# Patient Record
Sex: Male | Born: 1970
Health system: Southern US, Community
[De-identification: ages and names within clinical notes are randomized; demographics above are authoritative.]

## PROBLEM LIST (undated history)

## (undated) DIAGNOSIS — G4730 Sleep apnea, unspecified: Secondary | ICD-10-CM

## (undated) DIAGNOSIS — E785 Hyperlipidemia, unspecified: Secondary | ICD-10-CM

## (undated) DIAGNOSIS — F419 Anxiety disorder, unspecified: Secondary | ICD-10-CM

## (undated) DIAGNOSIS — E119 Type 2 diabetes mellitus without complications: Secondary | ICD-10-CM

## (undated) HISTORY — DX: Sleep apnea, unspecified: G47.30

## (undated) HISTORY — DX: Type 2 diabetes mellitus without complications: E11.9

## (undated) HISTORY — DX: Anxiety disorder, unspecified: F41.9

## (undated) HISTORY — DX: Hyperlipidemia, unspecified: E78.5

---

## 2008-04-09 HISTORY — PX: ANKLE ARTHROSCOPY: SUR85

## 2010-07-10 ENCOUNTER — Other Ambulatory Visit (HOSPITAL_COMMUNITY): Payer: Self-pay | Admitting: Sports Medicine

## 2010-07-10 ENCOUNTER — Ambulatory Visit (HOSPITAL_COMMUNITY)
Admission: RE | Admit: 2010-07-10 | Discharge: 2010-07-10 | Disposition: A | Discharge status: Home / Self Care | Payer: PRIVATE HEALTH INSURANCE | Source: Ambulatory Visit | Attending: Sports Medicine | Admitting: Sports Medicine

## 2010-07-10 DIAGNOSIS — M25572 Pain in left ankle and joints of left foot: Secondary | ICD-10-CM

## 2010-07-10 DIAGNOSIS — J3489 Other specified disorders of nose and nasal sinuses: Secondary | ICD-10-CM | POA: Insufficient documentation

## 2010-07-10 DIAGNOSIS — Z01818 Encounter for other preprocedural examination: Secondary | ICD-10-CM | POA: Insufficient documentation

## 2016-08-31 DIAGNOSIS — R739 Hyperglycemia, unspecified: Secondary | ICD-10-CM | POA: Diagnosis not present

## 2016-08-31 DIAGNOSIS — Z1389 Encounter for screening for other disorder: Secondary | ICD-10-CM | POA: Diagnosis not present

## 2016-08-31 DIAGNOSIS — E785 Hyperlipidemia, unspecified: Secondary | ICD-10-CM | POA: Diagnosis not present

## 2016-08-31 DIAGNOSIS — R030 Elevated blood-pressure reading, without diagnosis of hypertension: Secondary | ICD-10-CM | POA: Diagnosis not present

## 2016-11-05 DIAGNOSIS — Z800 Family history of malignant neoplasm of digestive organs: Secondary | ICD-10-CM | POA: Diagnosis not present

## 2016-11-05 DIAGNOSIS — Z86010 Personal history of colonic polyps: Secondary | ICD-10-CM | POA: Diagnosis not present

## 2016-11-05 DIAGNOSIS — Z1211 Encounter for screening for malignant neoplasm of colon: Secondary | ICD-10-CM | POA: Diagnosis not present

## 2016-11-05 DIAGNOSIS — D124 Benign neoplasm of descending colon: Secondary | ICD-10-CM | POA: Diagnosis not present

## 2016-11-05 DIAGNOSIS — K635 Polyp of colon: Secondary | ICD-10-CM | POA: Diagnosis not present

## 2017-04-25 DIAGNOSIS — J0100 Acute maxillary sinusitis, unspecified: Secondary | ICD-10-CM | POA: Diagnosis not present

## 2017-06-18 DIAGNOSIS — Z0000 Encounter for general adult medical examination without abnormal findings: Secondary | ICD-10-CM | POA: Diagnosis not present

## 2017-06-18 DIAGNOSIS — Z23 Encounter for immunization: Secondary | ICD-10-CM | POA: Diagnosis not present

## 2017-06-18 DIAGNOSIS — E785 Hyperlipidemia, unspecified: Secondary | ICD-10-CM | POA: Diagnosis not present

## 2017-08-30 DIAGNOSIS — E785 Hyperlipidemia, unspecified: Secondary | ICD-10-CM | POA: Diagnosis not present

## 2017-12-10 DIAGNOSIS — E785 Hyperlipidemia, unspecified: Secondary | ICD-10-CM | POA: Diagnosis not present

## 2017-12-10 DIAGNOSIS — R7303 Prediabetes: Secondary | ICD-10-CM | POA: Diagnosis not present

## 2018-01-24 DIAGNOSIS — Z23 Encounter for immunization: Secondary | ICD-10-CM | POA: Diagnosis not present

## 2018-03-20 DIAGNOSIS — Z6837 Body mass index (BMI) 37.0-37.9, adult: Secondary | ICD-10-CM | POA: Diagnosis not present

## 2018-03-20 DIAGNOSIS — L03811 Cellulitis of head [any part, except face]: Secondary | ICD-10-CM | POA: Diagnosis not present

## 2018-04-03 DIAGNOSIS — L03221 Cellulitis of neck: Secondary | ICD-10-CM | POA: Diagnosis not present

## 2018-04-03 DIAGNOSIS — L728 Other follicular cysts of the skin and subcutaneous tissue: Secondary | ICD-10-CM | POA: Diagnosis not present

## 2018-04-04 DIAGNOSIS — R7303 Prediabetes: Secondary | ICD-10-CM | POA: Diagnosis not present

## 2018-04-04 DIAGNOSIS — E785 Hyperlipidemia, unspecified: Secondary | ICD-10-CM | POA: Diagnosis not present

## 2018-04-04 DIAGNOSIS — L03811 Cellulitis of head [any part, except face]: Secondary | ICD-10-CM | POA: Diagnosis not present

## 2018-06-27 DIAGNOSIS — J069 Acute upper respiratory infection, unspecified: Secondary | ICD-10-CM | POA: Diagnosis not present

## 2018-07-03 DIAGNOSIS — Z0000 Encounter for general adult medical examination without abnormal findings: Secondary | ICD-10-CM | POA: Diagnosis not present

## 2018-07-03 DIAGNOSIS — R739 Hyperglycemia, unspecified: Secondary | ICD-10-CM | POA: Diagnosis not present

## 2018-07-03 DIAGNOSIS — E669 Obesity, unspecified: Secondary | ICD-10-CM | POA: Diagnosis not present

## 2018-07-03 DIAGNOSIS — E785 Hyperlipidemia, unspecified: Secondary | ICD-10-CM | POA: Diagnosis not present

## 2018-07-22 DIAGNOSIS — G4730 Sleep apnea, unspecified: Secondary | ICD-10-CM | POA: Diagnosis not present

## 2018-08-08 DIAGNOSIS — G4733 Obstructive sleep apnea (adult) (pediatric): Secondary | ICD-10-CM | POA: Diagnosis not present

## 2020-04-10 ENCOUNTER — Encounter (HOSPITAL_COMMUNITY): Payer: Self-pay | Admitting: Internal Medicine

## 2020-04-10 ENCOUNTER — Inpatient Hospital Stay (HOSPITAL_COMMUNITY)
Admission: AD | Admit: 2020-04-10 | Discharge: 2020-04-12 | DRG: 281 | Disposition: A | Discharge status: Home / Self Care | Payer: Commercial Managed Care - PPO | Source: Other Acute Inpatient Hospital | Attending: Cardiovascular Disease | Admitting: Cardiovascular Disease

## 2020-04-10 DIAGNOSIS — I5181 Takotsubo syndrome: Secondary | ICD-10-CM | POA: Diagnosis present

## 2020-04-10 DIAGNOSIS — I214 Non-ST elevation (NSTEMI) myocardial infarction: Principal | ICD-10-CM | POA: Diagnosis present

## 2020-04-10 DIAGNOSIS — R079 Chest pain, unspecified: Secondary | ICD-10-CM | POA: Diagnosis present

## 2020-04-10 DIAGNOSIS — I200 Unstable angina: Secondary | ICD-10-CM | POA: Diagnosis not present

## 2020-04-10 DIAGNOSIS — E119 Type 2 diabetes mellitus without complications: Secondary | ICD-10-CM | POA: Diagnosis present

## 2020-04-10 DIAGNOSIS — E785 Hyperlipidemia, unspecified: Secondary | ICD-10-CM | POA: Diagnosis present

## 2020-04-10 DIAGNOSIS — I428 Other cardiomyopathies: Secondary | ICD-10-CM

## 2020-04-10 DIAGNOSIS — E782 Mixed hyperlipidemia: Secondary | ICD-10-CM | POA: Diagnosis not present

## 2020-04-10 DIAGNOSIS — I249 Acute ischemic heart disease, unspecified: Secondary | ICD-10-CM

## 2020-04-10 LAB — COMPREHENSIVE METABOLIC PANEL
ALT: 34 U/L (ref 0–44)
AST: 26 U/L (ref 15–41)
Albumin: 3.6 g/dL (ref 3.5–5.0)
Alkaline Phosphatase: 57 U/L (ref 38–126)
Anion gap: 12 (ref 5–15)
BUN: 8 mg/dL (ref 6–20)
CO2: 25 mmol/L (ref 22–32)
Calcium: 9 mg/dL (ref 8.9–10.3)
Chloride: 100 mmol/L (ref 98–111)
Creatinine, Ser: 0.95 mg/dL (ref 0.61–1.24)
GFR, Estimated: >60 mL/min (ref >60)
Glucose, Bld: 103 mg/dL — ABNORMAL HIGH (ref 70–99)
Potassium: 3.6 mmol/L (ref 3.5–5.1)
Sodium: 137 mmol/L (ref 135–145)
Total Bilirubin: 0.8 mg/dL (ref 0.3–1.2)
Total Protein: 7 g/dL (ref 6.5–8.1)

## 2020-04-10 LAB — PROTIME-INR
INR: 1.1 (ref 0.8–1.2)
Prothrombin Time: 13.7 seconds (ref 11.4–15.2)

## 2020-04-10 LAB — HIV ANTIBODY (ROUTINE TESTING W REFLEX): HIV Screen 4th Generation wRfx: NONREACTIVE

## 2020-04-10 LAB — TROPONIN I (HIGH SENSITIVITY)
Troponin I (High Sensitivity): 524 ng/L (ref <18)
Troponin I (High Sensitivity): 971 ng/L (ref <18)

## 2020-04-10 LAB — APTT: aPTT: 41 seconds — ABNORMAL HIGH (ref 24–36)

## 2020-04-10 LAB — MRSA PCR SCREENING: MRSA by PCR: NEGATIVE

## 2020-04-10 LAB — MAGNESIUM: Magnesium: 2.4 mg/dL (ref 1.7–2.4)

## 2020-04-10 LAB — GLUCOSE, CAPILLARY: Glucose-Capillary: 102 mg/dL — ABNORMAL HIGH (ref 70–99)

## 2020-04-10 LAB — HEPARIN LEVEL (UNFRACTIONATED): Heparin Unfractionated: <0.1 IU/mL — ABNORMAL LOW (ref 0.30–0.70)

## 2020-04-10 MED ORDER — ASPIRIN EC 81 MG PO TBEC
81.0000 mg | DELAYED_RELEASE_TABLET | Freq: Every day | ORAL | Status: DC
  Administered 2020-04-11 – 2020-04-12 (×2): 81 mg via ORAL
  Filled 2020-04-10 (×2): qty 1

## 2020-04-10 MED ORDER — HEPARIN BOLUS VIA INFUSION
2000.0000 [IU] | Freq: Once | INTRAVENOUS | Status: AC
  Administered 2020-04-10: 2000 [IU] via INTRAVENOUS
  Filled 2020-04-10: qty 2000

## 2020-04-10 MED ORDER — ONDANSETRON HCL 4 MG/2ML IJ SOLN: 4.0000 mg | Freq: Four times a day (QID) | INTRAMUSCULAR | Status: DC | PRN

## 2020-04-10 MED ORDER — NITROGLYCERIN 0.4 MG SL SUBL: 0.4000 mg | SUBLINGUAL_TABLET | SUBLINGUAL | Status: DC | PRN

## 2020-04-10 MED ORDER — ACETAMINOPHEN 325 MG PO TABS: 650.0000 mg | ORAL_TABLET | ORAL | Status: DC | PRN

## 2020-04-10 MED ORDER — HEPARIN (PORCINE) 25000 UT/250ML-% IV SOLN
2000.0000 [IU]/h | INTRAVENOUS | Status: DC
  Administered 2020-04-10: 1400 [IU]/h via INTRAVENOUS
  Administered 2020-04-11: 2000 [IU]/h via INTRAVENOUS
  Filled 2020-04-10 (×3): qty 250

## 2020-04-10 MED ORDER — ROSUVASTATIN CALCIUM 20 MG PO TABS: 20.0000 mg | ORAL_TABLET | Freq: Every day | ORAL | Status: DC

## 2020-04-10 NOTE — H&P (Addendum)
CARDIOLOGY ADMISSION NOTE  Patient ID: Patrick Strickland MRN: [id number] DOB/AGE: 49/49/1972 49 y.o.  Admit date: 04/10/2020 Primary Physician   None Primary Cardiologist   None Chief Complaint    Chest Pain  HPI:   Mr. Patrick Strickland is a 50 year old man with HLD and DMII who presented to OSH with acute onset chest pain and was found to have an NSTEMI.   Patient developed acute onset substernal chest pain around 1400 on 04/09/20. He describes it as a persistent "gas bubble sensation" with associated chest tightness, dizziness, nausea, and diaphoresis. There were no clear exacerbating or alleviating factors. Given his symptoms, he presented to an OSH ED for evaluation. EKG was reportedly without any dynamic ST changes. Troponin I was found to be elevated 0.10 > 0.10 > 0.24. He was started on a heparin gtt and plans were made for transfer.  At present, patient states that he is comfortable and denies any active chest pain, exertional chest pressure/discomfort, dyspnea/tachypnea, paroxysmal nocturnal dyspnea/orthopnea, irregular heart beat/palpitations, presyncope/syncope, lower extremity edema, claudication, or abdominal distention.    History reviewed. No pertinent past medical history.  History reviewed. No pertinent surgical history.  Not on File No current facility-administered medications on file prior to encounter.   No current outpatient medications on file prior to encounter.   Social History   Socioeconomic History  . Marital status: Married    Spouse name: Not on file  . Number of children: Not on file  . Years of education: Not on file  . Highest education level: Not on file  Occupational History  . Not on file  Tobacco Use  . Smoking status: Never Smoker  . Smokeless tobacco: Current User    Types: Chew  Vaping Use  . Vaping Use: Never used  Substance and Sexual Activity  . Alcohol use: Yes    Alcohol/week: 1.0 standard drink    Types: 1 Cans of beer per week  . Drug use: Not  on file  . Sexual activity: Yes  Other Topics Concern  . Not on file  Social History Narrative  . Not on file   Social Determinants of Health   Financial Resource Strain: Not on file  Food Insecurity: Not on file  Transportation Needs: Not on file  Physical Activity: Not on file  Stress: Not on file  Social Connections: Not on file  Intimate Partner Violence: Not on file    History reviewed. No pertinent family history.   Review of Systems: [y] = yes, [ ]  = no       General: Weight gain [] ; Weight loss [ ] ; Anorexia [ ] ; Fatigue [ ] ; Fever [ ] ; Chills [ ] ; Weakness [ ]     Cardiac: as reported in HPI  Pulmonary: Cough [ ] ; Wheezing[ ] ; Hemoptysis[ ] ; Sputum [ ] ; Snoring [ ]     GI: Vomiting[ ] ; Dysphagia[ ] ; Melena[ ] ; Hematochezia [ ] ; Heartburn[ ] ; Abdominal pain [ ] ; Constipation [ ] ; Diarrhea [ ] ; BRBPR [ ]     GU: Hematuria[ ] ; Dysuria [ ] ; Nocturia[ ]   Vascular: Pain in legs with walking [ ] ; Pain in feet with lying flat [ ] ; Non-healing sores [ ] ; Stroke [ ] ; TIA [ ] ; Slurred speech [ ] ;    Neuro: Headaches[ ] ; Vertigo[ ] ; Seizures[ ] ; Paresthesias[ ] ;Blurred vision [ ] ; Diplopia [ ] ; Vision changes [ ]     Ortho/Skin: Arthritis [ ] ; Joint pain [ ] ; Muscle pain [ ] ; Joint swelling [ ] ; Back  Pain [ ] ; Rash [ ]     Psych: Depression[ ] ; Anxiety[ ]     Heme: Bleeding problems [ ] ; Clotting disorders [ ] ; Anemia [ ]     Endocrine: Diabetes [ ] ; Thyroid dysfunction[ ]   Physical Exam: Blood pressure 128/85, pulse 81, temperature 98 F (36.7 C), temperature source Oral, resp. rate 14, height 5\' 10"  (1.778 m), weight 114.6 kg, SpO2 94 %.   GENERAL: Patient is afebrile, Vital signs reviewed, Well appearing, Patient appears comfortable, Alert and lucid. EYES: Normal inspection. HEENT:  normocephalic, atraumatic , normal ENT inspection. ORAL:  Moist NECK:  supple , normal inspection. CARD:  regular rate and rhythm, heart sounds normal. RESP:  no respiratory distress, breath  sounds normal. ABD: soft, nondistended, nontender to palpation  MUSC:  normal ROM, non-tender , no pedal edema . SKIN: color normal, no rash, warm, dry . NEURO: awake & alert, lucid, no motor/sensory deficit. Gait stable. PSYCH: mood/affect normal.   Labs: Lab Results  Component Value Date   BUN 8 04/10/2020   Lab Results  Component Value Date   CREATININE 0.95 04/10/2020   Lab Results  Component Value Date   NA 137 04/10/2020   K 3.6 04/10/2020   CL 100 04/10/2020   CO2 25 04/10/2020   No results found for: TROPONINI No results found for: WBC, HGB, HCT, MCV, PLT No results found for: CHOL, HDL, LDLCALC, LDLDIRECT, TRIG, CHOLHDL Lab Results  Component Value Date   ALT 34 04/10/2020   AST 26 04/10/2020   ALKPHOS 57 04/10/2020   BILITOT 0.8 04/10/2020      Radiology:  CXR:  No acute process CTA Chest: No evidence of PE  EKG:  Pending  ASSESSMENT AND PLAN:   Mr. Patrick Strickland is a 50 year old man with HLD and DMII who presented to OSH with acute onset chest pain and was found to have an NSTEMI.   # NSTEMI # HLD # DMII - Tentative plan for diagnostic LHC in AM  - Patient to be NPO except for meds - Cont heparin IV infusion protocol - Symptom management with SLNTG prn - Trend trop and serial ECGs; monitor on tele - TTE in AM - Aggressive risk factor modification with glycemic control, smoking cessation, and GDMT              - Lipid panel and A1c pending  - Cont ASA 81 mg and rosuvastatin 20mg  daily    Signed: 06/08/2020 04/10/2020, 10:21 PM

## 2020-04-10 NOTE — Progress Notes (Addendum)
ANTICOAGULATION CONSULT NOTE - Initial Consult  Pharmacy Consult for heparin  Indication: chest pain/ACS  Not on File  Patient Measurements: Height: 5\' 10"  (177.8 cm) Weight: 114.6 kg (252 lb 9.6 oz) (scale A) IBW/kg (Calculated) : 73 HEPARIN DW (KG): 98.2   Vital Signs: Temp: 98 F (36.7 C) (01/02 1944) Temp Source: Oral (01/02 1944) BP: 128/85 (01/02 1944) Pulse Rate: 81 (01/02 1944)  Labs: No results for input(s): HGB, HCT, PLT, APTT, LABPROT, INR, HEPARINUNFRC, HEPRLOWMOCWT, CREATININE, CKTOTAL, CKMB, TROPONINIHS in the last 72 hours.  CrCl cannot be calculated (No successful lab value found.).   Medical History: No past medical history on file.    Assessment: 50 yo male with CP transferred from [place]. He is on heparin for r/o ACS and current infusion is running at 1400 units/hr (started early this morning) -hg= 15.9 and plt= 168 (at [place])   Goal of Therapy:  Heparin level 0.3-0.7 units/ml Monitor platelets by anticoagulation protocol: Yes   Plan:   -Continue heparin at 1400  units/hr -Heparin level later tonight -Daily heparin level and CBC  [place], PharmD Clinical Pharmacist **Pharmacist phone directory can now be found on [email or web address] ([email or web address]).  Listed under [facility].   Addendum -heparin level < 0.1  Plan -heparin bolus 2000 units x1 and increase infusion to 1700 units/hr -Heparin level in 6 hours and daily wth CBC daily  [facility], PharmD Clinical Pharmacist **Pharmacist phone directory can now be found on [email or web address] ([email or web address]).  Listed under [facility].

## 2020-04-10 NOTE — Plan of Care (Signed)
  Problem: Education: Goal: Understanding of cardiac disease, CV risk reduction, and recovery process will improve Outcome: Progressing Goal: Understanding of medication regimen will improve Outcome: Progressing   Problem: Activity: Goal: Ability to tolerate increased activity will improve Outcome: Progressing   Problem: Cardiac: Goal: Ability to achieve and maintain adequate cardiopulmonary perfusion will improve Outcome: Progressing

## 2020-04-11 ENCOUNTER — Encounter (HOSPITAL_COMMUNITY): Payer: Self-pay | Admitting: Internal Medicine

## 2020-04-11 ENCOUNTER — Other Ambulatory Visit: Payer: Self-pay

## 2020-04-11 ENCOUNTER — Inpatient Hospital Stay (HOSPITAL_COMMUNITY): Payer: Commercial Managed Care - PPO

## 2020-04-11 ENCOUNTER — Encounter (HOSPITAL_COMMUNITY)
Admission: AD | Disposition: A | Discharge status: Home / Self Care | Payer: Self-pay | Attending: Cardiovascular Disease

## 2020-04-11 DIAGNOSIS — I214 Non-ST elevation (NSTEMI) myocardial infarction: Secondary | ICD-10-CM

## 2020-04-11 DIAGNOSIS — I200 Unstable angina: Secondary | ICD-10-CM | POA: Diagnosis not present

## 2020-04-11 DIAGNOSIS — R079 Chest pain, unspecified: Secondary | ICD-10-CM | POA: Diagnosis not present

## 2020-04-11 DIAGNOSIS — E782 Mixed hyperlipidemia: Secondary | ICD-10-CM

## 2020-04-11 HISTORY — PX: LEFT HEART CATH AND CORONARY ANGIOGRAPHY: CATH118249

## 2020-04-11 LAB — CBC
HCT: 48.4 % (ref 39.0–52.0)
Hemoglobin: 16.7 g/dL (ref 13.0–17.0)
MCH: 29.1 pg (ref 26.0–34.0)
MCHC: 34.5 g/dL (ref 30.0–36.0)
MCV: 84.5 fL (ref 80.0–100.0)
Platelets: 179 10*3/uL (ref 150–400)
RBC: 5.73 MIL/uL (ref 4.22–5.81)
RDW: 13.6 % (ref 11.5–15.5)
WBC: 8.2 10*3/uL (ref 4.0–10.5)
nRBC: 0 % (ref 0.0–0.2)

## 2020-04-11 LAB — BASIC METABOLIC PANEL
Anion gap: 12 (ref 5–15)
BUN: 8 mg/dL (ref 6–20)
CO2: 21 mmol/L — ABNORMAL LOW (ref 22–32)
Calcium: 8.9 mg/dL (ref 8.9–10.3)
Chloride: 102 mmol/L (ref 98–111)
Creatinine, Ser: 0.79 mg/dL (ref 0.61–1.24)
GFR, Estimated: >60 mL/min (ref >60)
Glucose, Bld: 103 mg/dL — ABNORMAL HIGH (ref 70–99)
Potassium: 4 mmol/L (ref 3.5–5.1)
Sodium: 135 mmol/L (ref 135–145)

## 2020-04-11 LAB — LIPID PANEL
Cholesterol: 160 mg/dL (ref 0–200)
HDL: 41 mg/dL (ref >40)
LDL Cholesterol: 88 mg/dL (ref 0–99)
Total CHOL/HDL Ratio: 3.9 RATIO
Triglycerides: 153 mg/dL — ABNORMAL HIGH (ref <150)
VLDL: 31 mg/dL (ref 0–40)

## 2020-04-11 LAB — ECHOCARDIOGRAM COMPLETE
Area-P 1/2: 3.99 cm2
Height: 70 in
S' Lateral: 2.9 cm
Weight: 4041.6 oz

## 2020-04-11 LAB — HEPARIN LEVEL (UNFRACTIONATED): Heparin Unfractionated: 0.15 IU/mL — ABNORMAL LOW (ref 0.30–0.70)

## 2020-04-11 LAB — TROPONIN I (HIGH SENSITIVITY)
Troponin I (High Sensitivity): 2798 ng/L (ref <18)
Troponin I (High Sensitivity): 3104 ng/L (ref <18)

## 2020-04-11 LAB — GLUCOSE, CAPILLARY: Glucose-Capillary: 112 mg/dL — ABNORMAL HIGH (ref 70–99)

## 2020-04-11 SURGERY — LEFT HEART CATH AND CORONARY ANGIOGRAPHY
Anesthesia: LOCAL

## 2020-04-11 MED ORDER — HEPARIN (PORCINE) IN NACL 1000-0.9 UT/500ML-% IV SOLN
INTRAVENOUS | Status: AC
  Filled 2020-04-11: qty 1000

## 2020-04-11 MED ORDER — HEPARIN BOLUS VIA INFUSION
2000.0000 [IU] | Freq: Once | INTRAVENOUS | Status: AC
  Administered 2020-04-11: 2000 [IU] via INTRAVENOUS
  Filled 2020-04-11: qty 2000

## 2020-04-11 MED ORDER — GADOBUTROL 1 MMOL/ML IV SOLN
13.0000 mL | Freq: Once | INTRAVENOUS | Status: AC | PRN
  Administered 2020-04-11: 13 mL via INTRAVENOUS

## 2020-04-11 MED ORDER — MIDAZOLAM HCL 2 MG/2ML IJ SOLN
INTRAMUSCULAR | Status: DC | PRN
  Administered 2020-04-11 (×2): 1 mg via INTRAVENOUS

## 2020-04-11 MED ORDER — SODIUM CHLORIDE 0.9% FLUSH
3.0000 mL | Freq: Two times a day (BID) | INTRAVENOUS | Status: DC
  Administered 2020-04-12: 3 mL via INTRAVENOUS

## 2020-04-11 MED ORDER — PERFLUTREN LIPID MICROSPHERE
1.0000 mL | INTRAVENOUS | Status: AC | PRN
  Administered 2020-04-11: 2 mL via INTRAVENOUS
  Filled 2020-04-11: qty 10

## 2020-04-11 MED ORDER — HEPARIN (PORCINE) IN NACL 1000-0.9 UT/500ML-% IV SOLN
INTRAVENOUS | Status: DC | PRN
  Administered 2020-04-11: 500 mL

## 2020-04-11 MED ORDER — FENTANYL CITRATE (PF) 100 MCG/2ML IJ SOLN
INTRAMUSCULAR | Status: DC | PRN
  Administered 2020-04-11: 50 ug via INTRAVENOUS
  Administered 2020-04-11: 25 ug via INTRAVENOUS

## 2020-04-11 MED ORDER — SODIUM CHLORIDE 0.9 % IV SOLN: 250.0000 mL | INTRAVENOUS | Status: DC | PRN

## 2020-04-11 MED ORDER — SACUBITRIL-VALSARTAN 24-26 MG PO TABS
1.0000 | ORAL_TABLET | Freq: Two times a day (BID) | ORAL | Status: DC
  Administered 2020-04-11 – 2020-04-12 (×2): 1 via ORAL
  Filled 2020-04-11 (×2): qty 1

## 2020-04-11 MED ORDER — SODIUM CHLORIDE 0.9 % IV SOLN: INTRAVENOUS | Status: AC

## 2020-04-11 MED ORDER — MIDAZOLAM HCL 2 MG/2ML IJ SOLN
INTRAMUSCULAR | Status: AC
  Filled 2020-04-11: qty 2

## 2020-04-11 MED ORDER — FENTANYL CITRATE (PF) 100 MCG/2ML IJ SOLN
INTRAMUSCULAR | Status: AC
  Filled 2020-04-11: qty 2

## 2020-04-11 MED ORDER — ROSUVASTATIN CALCIUM 20 MG PO TABS
40.0000 mg | ORAL_TABLET | Freq: Every day | ORAL | Status: DC
  Administered 2020-04-11 – 2020-04-12 (×2): 40 mg via ORAL
  Filled 2020-04-11 (×2): qty 2

## 2020-04-11 MED ORDER — ENOXAPARIN SODIUM 40 MG/0.4ML ~~LOC~~ SOLN
40.0000 mg | SUBCUTANEOUS | Status: DC
  Administered 2020-04-12: 40 mg via SUBCUTANEOUS
  Filled 2020-04-11: qty 0.4

## 2020-04-11 MED ORDER — HEPARIN SODIUM (PORCINE) 1000 UNIT/ML IJ SOLN
INTRAMUSCULAR | Status: DC | PRN
  Administered 2020-04-11: 5000 [IU] via INTRAVENOUS

## 2020-04-11 MED ORDER — SODIUM CHLORIDE 0.9 % WEIGHT BASED INFUSION
1.0000 mL/kg/h | INTRAVENOUS | Status: DC
  Administered 2020-04-11: 1 mL/kg/h via INTRAVENOUS

## 2020-04-11 MED ORDER — LIDOCAINE HCL (PF) 1 % IJ SOLN
INTRAMUSCULAR | Status: AC
  Filled 2020-04-11: qty 30

## 2020-04-11 MED ORDER — LIDOCAINE HCL (PF) 1 % IJ SOLN
INTRAMUSCULAR | Status: DC | PRN
  Administered 2020-04-11: 2 mL

## 2020-04-11 MED ORDER — HEPARIN SODIUM (PORCINE) 1000 UNIT/ML IJ SOLN
INTRAMUSCULAR | Status: AC
  Filled 2020-04-11: qty 1

## 2020-04-11 MED ORDER — LABETALOL HCL 5 MG/ML IV SOLN: 10.0000 mg | INTRAVENOUS | Status: AC | PRN

## 2020-04-11 MED ORDER — METOPROLOL SUCCINATE ER 25 MG PO TB24
25.0000 mg | ORAL_TABLET | Freq: Every day | ORAL | Status: DC
Start: 2020-04-11 — End: 2020-04-12
  Administered 2020-04-11 – 2020-04-12 (×2): 25 mg via ORAL
  Filled 2020-04-11 (×2): qty 1

## 2020-04-11 MED ORDER — SODIUM CHLORIDE 0.9% FLUSH
3.0000 mL | INTRAVENOUS | Status: DC | PRN
Start: 2020-04-11 — End: 2020-04-12

## 2020-04-11 MED ORDER — IOHEXOL 350 MG/ML SOLN
INTRAVENOUS | Status: DC | PRN
  Administered 2020-04-11: 55 mL

## 2020-04-11 MED ORDER — VERAPAMIL HCL 2.5 MG/ML IV SOLN
INTRAVENOUS | Status: DC | PRN
  Administered 2020-04-11: 10 mL via INTRA_ARTERIAL

## 2020-04-11 MED ORDER — HYDRALAZINE HCL 20 MG/ML IJ SOLN: 10.0000 mg | INTRAMUSCULAR | Status: AC | PRN

## 2020-04-11 MED ORDER — VERAPAMIL HCL 2.5 MG/ML IV SOLN
INTRAVENOUS | Status: AC
  Filled 2020-04-11: qty 2

## 2020-04-11 MED ORDER — SODIUM CHLORIDE 0.9 % WEIGHT BASED INFUSION: 3.0000 mL/kg/h | INTRAVENOUS | Status: DC

## 2020-04-11 MED ORDER — ASPIRIN 81 MG PO CHEW: 81.0000 mg | CHEWABLE_TABLET | ORAL | Status: DC

## 2020-04-11 SURGICAL SUPPLY — 13 items
CATH 5FR JL3.5 JR4 ANG PIG MP (CATHETERS) ×1 IMPLANT
DEVICE RAD COMP TR BAND LRG (VASCULAR PRODUCTS) ×1 IMPLANT
GLIDESHEATH SLEND SS 6F .021 (SHEATH) ×1 IMPLANT
GUIDEWIRE INQWIRE 1.5J.035X260 (WIRE) IMPLANT
INQWIRE 1.5J .035X260CM (WIRE) ×2
KIT HEART LEFT (KITS) ×2 IMPLANT
PACK CARDIAC CATHETERIZATION (CUSTOM PROCEDURE TRAY) ×2 IMPLANT
SHEATH PROBE COVER 6X72 (BAG) ×1 IMPLANT
SYR MEDRAD MARK 7 150ML (SYRINGE) ×2 IMPLANT
TRANSDUCER W/STOPCOCK (MISCELLANEOUS) ×2 IMPLANT
TUBING ART PRESS 72  MALE/FEM (TUBING) ×1
TUBING ART PRESS 72 MALE/FEM (TUBING) IMPLANT
TUBING CIL FLEX 10 FLL-RA (TUBING) ×2 IMPLANT

## 2020-04-11 NOTE — Interval H&P Note (Signed)
History and Physical Interval Note:  04/11/2020 11:15 AM  Patrick Strickland  has presented today for surgery, with the diagnosis of NSTEMI.  The various methods of treatment have been discussed with the patient and family. After consideration of risks, benefits and other options for treatment, the patient has consented to  Procedure(s): LEFT HEART CATH AND CORONARY ANGIOGRAPHY (N/A) as a surgical intervention.  The patient's history has been reviewed, patient examined, no change in status, stable for surgery.  I have reviewed the patient's chart and labs.  Questions were answered to the patient's satisfaction.    Cath Lab Visit (complete for each Cath Lab visit)  Clinical Evaluation Leading to the Procedure:   ACS: Yes.    Non-ACS:  N/A  Nisha Dhami

## 2020-04-11 NOTE — Progress Notes (Signed)
ANTICOAGULATION CONSULT NOTE - Initial Consult  Pharmacy Consult for heparin  Indication: chest pain/ACS  Not on File  Patient Measurements: Height: 5\' 10"  (177.8 cm) Weight: 114.6 kg (252 lb 9.6 oz) IBW/kg (Calculated) : 73 HEPARIN DW (KG): 98.2   Vital Signs: Temp: 96.9 F (36.1 C) (01/03 0730) Temp Source: Oral (01/03 0730) BP: 124/92 (01/03 0730) Pulse Rate: 87 (01/03 0730)  Labs: Recent Labs    04/10/20 2011 04/10/20 2157 04/11/20 0610  HGB  --   --  16.7  HCT  --   --  48.4  PLT  --   --  179  APTT 41*  --   --   LABPROT 13.7  --   --   INR 1.1  --   --   HEPARINUNFRC <0.10*  --  0.15*  CREATININE 0.95  --   --   TROPONINIHS 524* 971*  --     Estimated Creatinine Clearance: 120.5 mL/min (by C-G formula based on SCr of 0.95 mg/dL).   Medical History: History reviewed. No pertinent past medical history.    Assessment: 50 yo male with CP transferred from [place]. He is on heparin for r/o ACS.  Heparin level still low this morning at 0.15, cbc stable. No bleeding issues noted. Currently getting echo done.    Goal of Therapy:  Heparin level 0.3-0.7 units/ml Monitor platelets by anticoagulation protocol: Yes   Plan:   -Rebolus heparin 2000 units then increase infusion to 2000/hr.  -Planning cath today  [place] PharmD., BCPS Clinical Pharmacist 04/11/2020 9:33 AM

## 2020-04-11 NOTE — H&P (View-Only) (Signed)
Progress Note  Patient Name: Patrick Strickland Date of Encounter: 04/11/2020  [facility] Cardiologist: Dr. Nanetta Batty  Subjective   No chest pain or shortness of breath this morning  Inpatient Medications    Scheduled Meds: . aspirin EC  81 mg Oral Daily  . heparin  2,000 Units Intravenous Once  . rosuvastatin  20 mg Oral Daily   Continuous Infusions: . heparin 2,000 Units/hr (04/11/20 0754)   PRN Meds: acetaminophen, nitroGLYCERIN, ondansetron (ZOFRAN) IV   Vital Signs    Vitals:   04/11/20 0100 04/11/20 0300 04/11/20 0400 04/11/20 0730  BP:  (!) 131/94  (!) 124/92  Pulse: 80 85 94 87  Resp: (!) 9 20 (!) 22 13  Temp:  (!) 97.1 F (36.2 C)  (!) 96.9 F (36.1 C)  TempSrc:  Oral  Oral  SpO2: 95% 98% 96% 95%  Weight:  114.6 kg    Height:        Intake/Output Summary (Last 24 hours) at 04/11/2020 0924 Last data filed at 04/11/2020 0600 Gross per 24 hour  Intake 158.68 ml  Output 800 ml  Net -641.32 ml   Last 3 Weights 04/11/2020 04/10/2020  Weight (lbs) 252 lb 9.6 oz 252 lb 9.6 oz  Weight (kg) 114.579 kg 114.579 kg      Telemetry    Sinus rhythm- Personally Reviewed  ECG    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   Neck: No JVD Cardiac: RRR, no murmurs, rubs, or gallops.  Respiratory: Clear to auscultation bilaterally. GI: Soft, nontender, non-distended  MS: No edema; No deformity. Neuro:  Nonfocal  Psych: Normal affect   Labs    High Sensitivity Troponin:   Recent Labs  Lab 04/10/20 2011 04/10/20 2157 04/11/20 0610 04/11/20 0729  TROPONINIHS 524* 971* 2,798* 3,104*      Chemistry Recent Labs  Lab 04/10/20 2011 04/11/20 0610  NA 137 135  K 3.6 4.0  CL 100 102  CO2 25 21*  GLUCOSE 103* 103*  BUN 8 8  CREATININE 0.95 0.79  CALCIUM 9.0 8.9  PROT 7.0  --   ALBUMIN 3.6  --   AST 26  --   ALT 34  --   ALKPHOS 57  --   BILITOT 0.8  --   GFRNONAA >60 >60   ANIONGAP 12 12     Hematology Recent Labs  Lab 04/11/20 0610  WBC 8.2  RBC 5.73  HGB 16.7  HCT 48.4  MCV 84.5  MCH 29.1  MCHC 34.5  RDW 13.6  PLT 179    BNPNo results for input(s): BNP, PROBNP in the last 168 hours.   DDimer No results for input(s): DDIMER in the last 168 hours.   Radiology    No results found.  Cardiac Studies   Not performed  Patient Profile     50 y.o. male without prior cardiac history presented run of possible chest pain.  Does have treated hyperlipidemia and on Metformin for elevated hemoglobin A1c.  His troponins are mildly elevated currently in the 3000 range.  He is on IV heparin and is pain-free.  Assessment & Plan    1: Non-STEMI-troponins peaked in the 3000 range.  He is on IV heparin without recurrent symptoms.  Plan for left heart cath this morning. The patient understands that risks included but are not limited to stroke (1 in 1000), death (1 in 1000), kidney failure [usually temporary] (  1 in 500), bleeding (1 in 200), allergic reaction [possibly serious] (1 in 200). The patient understands and agrees to proceed  2: Hyperlipidemia-on statin therapy with lipid profile revealing total cholesterol 160, LDL of 88 and HDL of 41.  Based on this we will increase his Crestor    For questions or updates, please contact [place] Please consult [email or web address] for contact info under        Signed, Quay Burow, MD  04/11/2020, 9:24 AM

## 2020-04-11 NOTE — Progress Notes (Signed)
Echocardiogram 2D Echocardiogram has been performed.  Patrick Strickland 04/11/2020, 9:33 AM

## 2020-04-11 NOTE — Progress Notes (Addendum)
Progress Note  Patient Name: Patrick Strickland Date of Encounter: 04/11/2020  [facility] Cardiologist: Dr. Nanetta Batty  Subjective   No chest pain or shortness of breath this morning  Inpatient Medications    Scheduled Meds: . aspirin EC  81 mg Oral Daily  . heparin  2,000 Units Intravenous Once  . rosuvastatin  20 mg Oral Daily   Continuous Infusions: . heparin 2,000 Units/hr (04/11/20 0754)   PRN Meds: acetaminophen, nitroGLYCERIN, ondansetron (ZOFRAN) IV   Vital Signs    Vitals:   04/11/20 0100 04/11/20 0300 04/11/20 0400 04/11/20 0730  BP:  (!) 131/94  (!) 124/92  Pulse: 80 85 94 87  Resp: (!) 9 20 (!) 22 13  Temp:  (!) 97.1 F (36.2 C)  (!) 96.9 F (36.1 C)  TempSrc:  Oral  Oral  SpO2: 95% 98% 96% 95%  Weight:  114.6 kg    Height:        Intake/Output Summary (Last 24 hours) at 04/11/2020 0924 Last data filed at 04/11/2020 0600 Gross per 24 hour  Intake 158.68 ml  Output 800 ml  Net -641.32 ml   Last 3 Weights 04/11/2020 04/10/2020  Weight (lbs) 252 lb 9.6 oz 252 lb 9.6 oz  Weight (kg) 114.579 kg 114.579 kg      Telemetry    Sinus rhythm- Personally Reviewed  ECG    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   Neck: No JVD Cardiac: RRR, no murmurs, rubs, or gallops.  Respiratory: Clear to auscultation bilaterally. GI: Soft, nontender, non-distended  MS: No edema; No deformity. Neuro:  Nonfocal  Psych: Normal affect   Labs    High Sensitivity Troponin:   Recent Labs  Lab 04/10/20 2011 04/10/20 2157 04/11/20 0610 04/11/20 0729  TROPONINIHS 524* 971* 2,798* 3,104*      Chemistry Recent Labs  Lab 04/10/20 2011 04/11/20 0610  NA 137 135  K 3.6 4.0  CL 100 102  CO2 25 21*  GLUCOSE 103* 103*  BUN 8 8  CREATININE 0.95 0.79  CALCIUM 9.0 8.9  PROT 7.0  --   ALBUMIN 3.6  --   AST 26  --   ALT 34  --   ALKPHOS 57  --   BILITOT 0.8  --   GFRNONAA >60 >60   ANIONGAP 12 12     Hematology Recent Labs  Lab 04/11/20 0610  WBC 8.2  RBC 5.73  HGB 16.7  HCT 48.4  MCV 84.5  MCH 29.1  MCHC 34.5  RDW 13.6  PLT 179    BNPNo results for input(s): BNP, PROBNP in the last 168 hours.   DDimer No results for input(s): DDIMER in the last 168 hours.   Radiology    No results found.  Cardiac Studies   Not performed  Patient Profile     50 y.o. male without prior cardiac history presented run of possible chest pain.  Does have treated hyperlipidemia and on Metformin for elevated hemoglobin A1c.  His troponins are mildly elevated currently in the 3000 range.  He is on IV heparin and is pain-free.  Assessment & Plan    1: Non-STEMI-troponins peaked in the 3000 range.  He is on IV heparin without recurrent symptoms.  Plan for left heart cath this morning. The patient understands that risks included but are not limited to stroke (1 in 1000), death (1 in 1000), kidney failure [usually temporary] (  1 in 500), bleeding (1 in 200), allergic reaction [possibly serious] (1 in 200). The patient understands and agrees to proceed  2: Hyperlipidemia-on statin therapy with lipid profile revealing total cholesterol 160, LDL of 88 and HDL of 41.  Based on this we will increase his Crestor    For questions or updates, please contact [facility] Please consult [email or web address] for contact info under        Signed, Nanetta Batty, MD  04/11/2020, 9:24 AM    Addendum: Patient back from cardiac cath performed by Dr. Okey Dupre which revealed normal coronary arteries with slightly sluggish coronary flow.  He did appear to have an anteroapical wall motion abnormality at the time of cath although the 2D echo to my eye looked normal.  Etiology for his enzyme leak is still unclear.  He may have a "Takotsubo" variant.  I am going to check a cardiac MRI.  If this is normal anticipate discharge tomorrow.  [name], M.D., FACP, [facility], [facility] [address] [place], [place]  [address]  [phone] 04/11/2020 12:51 PM

## 2020-04-11 NOTE — Progress Notes (Signed)
Heart Failure Stewardship Pharmacist Progress Note   PCP: No primary care provider on file. PCP-Cardiologist: No primary care provider on file.    HPI:  50 yo M with PMH of HLD and T2DM. He presented to the ED with NSTEMI. An ECHO was done on 04/11/20 and LVEF was mildly decreased to 40-45%. 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 Pending cMRI.  Current HF Medications: None  Prior to admission HF Medications: None  Pertinent Lab Values: . Serum creatinine 0.79, BUN 8, Potassium 4.0, Sodium 135, Magnesium 2.4   Vital Signs: . Weight: 252 lbs (admission weight: 252 lbs) . Blood pressure: 110-120/90s  . Heart rate: 80s   Medication Assistance / Insurance Benefits Check: Does the patient have prescription insurance?  Yes Type of insurance plan: commercial plan - UHC  Outpatient Pharmacy:  Prior to admission outpatient pharmacy: [facility] Is the patient willing to use [facility] TOC pharmacy at discharge? Yes Is the patient willing to transition their outpatient pharmacy to utilize a [facility] outpatient pharmacy?   Pending    Assessment: 1. Acute systolic CHF (EF 13-08%), due to NICM. NYHA class II symptoms. Pending cMRI today. - Consider starting beta blocker prior to discharge - Consider starting ACE/ARB/ARNI prior to discharge - May need to wait until hospital follow up appt to add spironolactone vs Farxiga pending BP and SCr trends    Plan: 1) Medication changes recommended at this time: - Start metoprolol XL 25 mg daily + Entresto 24/26 mg BID   2) Patient assistance application(s): - None pending - Qualifies for monthly copay cards since he has commercial insurance  3)  Education  - To be completed prior to discharge  Sharen Hones, PharmD, BCPS Heart Failure Engineer, building services Phone [phone]

## 2020-04-11 NOTE — Plan of Care (Signed)

## 2020-04-11 NOTE — Plan of Care (Signed)
  Problem: Education: Goal: Understanding of cardiac disease, CV risk reduction, and recovery process will improve Outcome: Progressing Goal: Understanding of medication regimen will improve Outcome: Progressing   Problem: Activity: Goal: Ability to tolerate increased activity will improve Outcome: Progressing   Problem: Cardiac: Goal: Ability to achieve and maintain adequate cardiopulmonary perfusion will improve Outcome: Progressing   Problem: Pain Managment: Goal: General experience of comfort will improve Outcome: Progressing   Problem: Safety: Goal: Ability to remain free from injury will improve Outcome: Progressing   Problem: Skin Integrity: Goal: Risk for impaired skin integrity will decrease Outcome: Progressing

## 2020-04-12 DIAGNOSIS — I428 Other cardiomyopathies: Secondary | ICD-10-CM | POA: Diagnosis not present

## 2020-04-12 DIAGNOSIS — E785 Hyperlipidemia, unspecified: Secondary | ICD-10-CM

## 2020-04-12 DIAGNOSIS — I214 Non-ST elevation (NSTEMI) myocardial infarction: Secondary | ICD-10-CM | POA: Diagnosis not present

## 2020-04-12 DIAGNOSIS — E119 Type 2 diabetes mellitus without complications: Secondary | ICD-10-CM

## 2020-04-12 LAB — HEMOGLOBIN A1C
Hgb A1c MFr Bld: 6.5 % — ABNORMAL HIGH (ref 4.8–5.6)
Mean Plasma Glucose: 140 mg/dL

## 2020-04-12 MED ORDER — ASPIRIN EC 81 MG PO TBEC: 81.0000 mg | DELAYED_RELEASE_TABLET | Freq: Every day | ORAL | 11 refills | Status: AC

## 2020-04-12 MED ORDER — SACUBITRIL-VALSARTAN 24-26 MG PO TABS: 1.0000 | ORAL_TABLET | Freq: Two times a day (BID) | ORAL | 6 refills | Status: DC

## 2020-04-12 MED ORDER — METOPROLOL SUCCINATE ER 25 MG PO TB24: 25.0000 mg | ORAL_TABLET | Freq: Every day | ORAL | 6 refills | Status: DC

## 2020-04-12 MED FILL — METOPROLOL SUCCINATE ER 25: 25 | 30 days supply | Qty: 30 | Fill #0 | Status: TO

## 2020-04-12 MED FILL — ENTRESTO 24 MG-26 MG TABLET: 24-26 | 30 days supply | Qty: 60 | Fill #0 | Status: TO

## 2020-04-12 MED FILL — ASPIRIN LOW DOSE 81 MG TBEC: 81 | 30 days supply | Qty: 30 | Fill #0 | Status: TO

## 2020-04-12 NOTE — Progress Notes (Signed)
Progress Note  Patient Name: BODEY FRIZELL Date of Encounter: 04/12/2020  [facility] Cardiologist: Dr. Nanetta Batty  Subjective   No chest pain or shortness of breath this morning.  Postop day #1 cardiac catheterization  Inpatient Medications    Scheduled Meds: . aspirin EC  81 mg Oral Daily  . enoxaparin (LOVENOX) injection  40 mg Subcutaneous Q24H  . metoprolol succinate  25 mg Oral Daily  . rosuvastatin  40 mg Oral Daily  . sacubitril-valsartan  1 tablet Oral BID  . sodium chloride flush  3 mL Intravenous Q12H   Continuous Infusions: . sodium chloride     PRN Meds: sodium chloride, acetaminophen, nitroGLYCERIN, ondansetron (ZOFRAN) IV, sodium chloride flush   Vital Signs    Vitals:   04/11/20 2314 04/12/20 0000 04/12/20 0334 04/12/20 0815  BP: (!) 99/57  116/81 113/72  Pulse: 80   75  Resp: 16 15 12 18   Temp: 98.1 F (36.7 C)  98.2 F (36.8 C) (!) 96.7 F (35.9 C)  TempSrc: Oral  Oral Oral  SpO2: 99%  100%   Weight:   112.8 kg   Height:        Intake/Output Summary (Last 24 hours) at 04/12/2020 0920 Last data filed at 04/12/2020 0700 Gross per 24 hour  Intake 120 ml  Output 1300 ml  Net -1180 ml   Last 3 Weights 04/12/2020 04/11/2020 04/10/2020  Weight (lbs) 248 lb 10.9 oz 252 lb 9.6 oz 252 lb 9.6 oz  Weight (kg) 112.8 kg 114.579 kg 114.579 kg      Telemetry    Sinus rhythm- Personally Reviewed  ECG    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   Neck: No JVD Cardiac: RRR, no murmurs, rubs, or gallops.  Respiratory: Clear to auscultation bilaterally. GI: Soft, nontender, non-distended  MS: No edema; No deformity. Neuro:  Nonfocal  Psych: Normal affect   Labs    High Sensitivity Troponin:   Recent Labs  Lab 04/10/20 2011 04/10/20 2157 04/11/20 0610 04/11/20 0729  TROPONINIHS 524* 971* 2,798* 3,104*      Chemistry Recent Labs  Lab 04/10/20 2011 04/11/20 0610   NA 137 135  K 3.6 4.0  CL 100 102  CO2 25 21*  GLUCOSE 103* 103*  BUN 8 8  CREATININE 0.95 0.79  CALCIUM 9.0 8.9  PROT 7.0  --   ALBUMIN 3.6  --   AST 26  --   ALT 34  --   ALKPHOS 57  --   BILITOT 0.8  --   GFRNONAA >60 >60  ANIONGAP 12 12     Hematology Recent Labs  Lab 04/11/20 0610  WBC 8.2  RBC 5.73  HGB 16.7  HCT 48.4  MCV 84.5  MCH 29.1  MCHC 34.5  RDW 13.6  PLT 179    BNPNo results for input(s): BNP, PROBNP in the last 168 hours.   DDimer No results for input(s): DDIMER in the last 168 hours.   Radiology    CARDIAC CATHETERIZATION  Result Date: 04/11/2020 Conclusions: 1. Mild, non-obstructive coronary artery disease with 10-20% lesion involving large D1 branch.  There is sluggish flow throughout the coronary bed raising the possibility of microvascular dysfunction. 2. Mildly to moderately reduced left ventricular systolic function with mid and apical anterior hypokinesis. 3. Normal left ventricular filling pressure. Recommendations: 1. 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  Question myocarditis versus unusual variant of stress-induced cardiomyopathy; cardiac MRI may be helpful. 2. Medical therapy and risk factor modification to prevent progression of mild coronary artery disease. Nelva Bush, MD [facility]   MR CARDIAC MORPHOLOGY W WO CONTRAST  Result Date: 04/11/2020 CLINICAL DATA:  50M presents with MINOCA EXAM: CARDIAC MRI TECHNIQUE: The patient was scanned on a 1.5 Tesla Siemens magnet. A dedicated cardiac coil was used. Functional imaging was done using Fiesta sequences. 2,3, and 4 chamber views were done to assess for RWMA's. Modified Simpson's rule using a short axis stack was used to calculate an ejection fraction on a dedicated work Conservation officer, nature. The patient received 13cc of Gadavist. After 10 minutes inversion recovery sequences were used to assess for  infiltration and scar tissue. CONTRAST:  13 cc  of Gadavist FINDINGS: Left ventricle: -Normal size -Mild systolic dysfunction -Normal native T1,T2, and ECV -No LGE LV EF: 49% (Normal 56-78%) Absolute volumes: LV EDV: 168mL (Normal 77-195 mL) LV ESV: 69mL (Normal 19-72 mL) LV SV: 93mL (Normal 51-133 mL) CO: 5.8L/min (Normal 2.8-8.8 L/min) Indexed volumes: LV EDV: 69mL/sq-m (Normal 47-92 mL/sq-m) LV ESV: 40mL/sq-m (Normal 13-30 mL/sq-m) LV SV: 51mL/sq-m (Normal 32-62 mL/sq-m) CI: 2.4L/min/sq-m (Normal 1.7-4.2 L/min/sq-m) Right ventricle: Normal size with low normal systolic function RV EF:  47% (Normal 47-74%) Absolute volumes: RV EDV: 1101mL (Normal 88-227 mL) RV ESV: 101mL (Normal 23-103 mL) RV SV: 21mL (Normal 52-138 mL) CO: 5.6L/min (Normal 2.8-8.8 L/min) Indexed volumes: RV EDV: 58mL/sq-m (Normal 55-105 mL/sq-m) RV ESV: 20mL/sq-m (Normal 15-43 mL/sq-m) RV SV: 2mL/sq-m (Normal 32-64 mL/sq-m) CI: 2.4L/min/sq-m (Normal 1.7-4.2 L/min/sq-m) Left atrium: Mild enlargement Right atrium: Normal size Mitral valve: Trivial regurgitation Aortic valve: No regurgitation Tricuspid valve: No regurgitation Pulmonic valve: No regurgitation Aorta: Normal proximal ascending aorta Pericardium: Normal IMPRESSION: 1.  No evidence of myocarditis with normal native T1, T2, and ECV 2.  No late gadolinium enhancement to suggest myocardial scar 3.  Normal LV size with mild systolic dysfunction (EF 99991111) 4.  Normal RV size with low normal systolic function (EF 99991111) Electronically Signed   By: Oswaldo Milian MD   On: 04/11/2020 22:30   ECHOCARDIOGRAM COMPLETE  Result Date: 04/11/2020    ECHOCARDIOGRAM REPORT   Patient Name:   RIGGINS HANSON Blish Date of Exam: 04/11/2020 Medical Rec #:  [id number]    Height:       70.0 in Accession #:    [id number]   Weight:       252.6 lb Date of Birth:  December 14, 1970    BSA:          2.305 m Patient Age:    50 years     BP:           124/92 mmHg Patient Gender: M            HR:           76 bpm. Exam Location:   Inpatient Procedure: 2D Echo, Color Doppler, Cardiac Doppler and Intracardiac            Opacification Agent Indications:    R07.9* Chest pain, unspecified  History:        Patient has no prior history of Echocardiogram examinations.                 Risk Factors:Diabetes and Dyslipidemia.  Sonographer:    Raquel Sarna Senior RDCS Referring Phys: [id number] [place]  1. Left ventricular ejection fraction, by estimation, is 40 to 45%. The left  ventricle has mildly decreased function. The left ventricle demonstrates global hypokinesis. Left ventricular diastolic parameters are consistent with Grade II diastolic dysfunction (pseudonormalization).  2. Right ventricular systolic function is normal. The right ventricular size is normal. There is normal pulmonary artery systolic pressure.  3. The mitral valve is normal in structure. Trivial mitral valve regurgitation. No evidence of mitral stenosis.  4. The aortic valve is tricuspid. Aortic valve regurgitation is not visualized. No aortic stenosis is present.  5. The inferior vena cava is normal in size with greater than 50% respiratory variability, suggesting right atrial pressure of 3 mmHg. FINDINGS  Left Ventricle: Left ventricular ejection fraction, by estimation, is 40 to 45%. The left ventricle has mildly decreased function. The left ventricle demonstrates global hypokinesis. The left ventricular internal cavity size was normal in size. There is  no left ventricular hypertrophy. Left ventricular diastolic parameters are consistent with Grade II diastolic dysfunction (pseudonormalization). Indeterminate filling pressures. Right Ventricle: The right ventricular size is normal. No increase in right ventricular wall thickness. Right ventricular systolic function is normal. There is normal pulmonary artery systolic pressure. The tricuspid regurgitant velocity is 2.07 m/s, and  with an assumed right atrial pressure of 3 mmHg, the estimated right ventricular systolic  pressure is [id number]. Left Atrium: Left atrial size was normal in size. Right Atrium: Right atrial size was normal in size. Pericardium: There is no evidence of pericardial effusion. Mitral Valve: The mitral valve is normal in structure. Trivial mitral valve regurgitation. No evidence of mitral valve stenosis. Tricuspid Valve: The tricuspid valve is normal in structure. Tricuspid valve regurgitation is trivial. No evidence of tricuspid stenosis. Aortic Valve: The aortic valve is tricuspid. Aortic valve regurgitation is not visualized. No aortic stenosis is present. Pulmonic Valve: The pulmonic valve was normal in structure. Pulmonic valve regurgitation is not visualized. No evidence of pulmonic stenosis. Aorta: The aortic root is normal in size and structure. Venous: The inferior vena cava is normal in size with greater than 50% respiratory variability, suggesting right atrial pressure of 3 mmHg. IAS/Shunts: No atrial level shunt detected by color flow Doppler.  LEFT VENTRICLE PLAX 2D LVIDd:         4.80 cm  Diastology LVIDs:         2.90 cm  LV e' medial:    7.07 cm/s LV PW:         1.00 cm  LV E/e' medial:  10.5 LV IVS:        1.00 cm  LV e' lateral:   8.38 cm/s LVOT diam:     2.10 cm  LV E/e' lateral: 8.9 LV SV:         60 LV SV Index:   26 LVOT Area:     3.46 cm  RIGHT VENTRICLE RV S prime:     8.59 cm/s TAPSE (M-mode): 1.9 cm LEFT ATRIUM             Index       RIGHT ATRIUM           Index LA diam:        2.90 cm 1.26 cm/m  RA Area:     15.10 cm LA Vol (A2C):   68.3 ml 29.63 ml/m RA Volume:   36.80 ml  15.96 ml/m LA Vol (A4C):   50.2 ml 21.78 ml/m LA Biplane Vol: 58.7 ml 25.47 ml/m  AORTIC VALVE LVOT Vmax:   87.40 cm/s LVOT Vmean:  60.500 cm/s LVOT VTI:  0.172 m  AORTA Ao Root diam: 3.10 cm Ao Asc diam:  3.20 cm MITRAL VALVE               TRICUSPID VALVE MV Area (PHT): 3.99 cm    TR Peak grad:   17.1 mmHg MV Decel Time: 190 msec    TR Vmax:        207.00 cm/s MV E velocity: 74.50 cm/s MV A velocity:  58.70 cm/s  SHUNTS MV E/A ratio:  1.27        Systemic VTI:  0.17 m                            Systemic Diam: 2.10 cm Chilton Si MD Electronically signed by Chilton Si MD Signature Date/Time: 04/11/2020/12:15:13 PM    Final     Cardiac Studies   2D echocardiogram (04/11/2018)  IMPRESSIONS    1. Left ventricular ejection fraction, by estimation, is 40 to 45%. The  left ventricle has mildly decreased function. The left ventricle  demonstrates global hypokinesis. Left ventricular diastolic parameters are  consistent with Grade II diastolic  dysfunction (pseudonormalization).  2. Right ventricular systolic function is normal. The right ventricular  size is normal. There is normal pulmonary artery systolic pressure.  3. The mitral valve is normal in structure. Trivial mitral valve  regurgitation. No evidence of mitral stenosis.  4. The aortic valve is tricuspid. Aortic valve regurgitation is not  visualized. No aortic stenosis is present.  5. The inferior vena cava is normal in size with greater than 50%  respiratory variability, suggesting right atrial pressure of 3 mmHg.   Cardiac catheterization (04/11/2020)  Conclusion  Conclusions: 1. Mild, non-obstructive coronary artery disease with 10-20% lesion involving large D1 branch.  There is sluggish flow throughout the coronary bed raising the possibility of microvascular dysfunction. 2. Mildly to moderately reduced left ventricular systolic function with mid and apical anterior hypokinesis. 3. Normal left ventricular filling pressure.  Recommendations: 1. 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  Question myocarditis versus unusual variant of stress-induced cardiomyopathy; cardiac MRI may be helpful. 2. Medical therapy and risk factor modification to prevent progression of mild coronary artery disease.  Yvonne Kendall, MD [facility]  Patient  Profile     50 y.o. male without prior cardiac history presented run of possible chest pain.  Does have treated hyperlipidemia and on Metformin for elevated hemoglobin A1c.  His troponins are mildly elevated currently in the 3000 range.  He underwent diagnostic coronary angiography yesterday.  Assessment & Plan    1: Non-STEMI-troponins peaked in the 3000 range.  He is on IV heparin without recurrent symptoms.  Cardiac catheterization revealed normal coronary arteries with moderate LV dysfunction.  2D echo likewise revealed an EF in the 40 to 45% range.  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  Cardiac MRI showed no evidence of myocarditis.  He is on beta-blocker and Entresto.  2: Hyperlipidemia-on statin therapy with lipid profile revealing total cholesterol 160, LDL of 88 and HDL of 41.  Based on this we will increase his Crestor  Patient is stable for discharge today on appropriate medications including aspirin, beta-blocker and Entresto.  We will get a TOC 7 office visit followed by return office visit with me in 4 to 6 weeks.  We will check a 2D echo in 3 months.  For questions or updates, please contact [facility]  Please consult [email or web address] for contact info under      Lorretta Harp, M.D., [place], [facility], [place], [place] [address] [place], [place]  [address]  [phone] 04/12/2020 9:24 AM

## 2020-04-12 NOTE — Progress Notes (Signed)
Heart Failure Stewardship Pharmacist Progress Note   PCP: Pcp, No PCP-Cardiologist: No primary care provider on file.    HPI:  50 yo M with PMH of HLD and T2DM. He presented to the ED with NSTEMI. An ECHO was done on 04/11/20 and LVEF was mildly decreased to 40-45%. 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 cMRI done on 04/12/19 and not found to have evidence of myocarditis.  Current HF Medications: Metoprolol XL 25 mg daily Entresto 24/26 mg BID  Prior to admission HF Medications: None  Pertinent Lab Values: . Serum creatinine 0.79, BUN 8, Potassium 4.0, Sodium 135, Magnesium 2.4   Vital Signs: . Weight: 248 lbs (admission weight: 252 lbs) . Blood pressure: 110/70s  . Heart rate: 70s   Medication Assistance / Insurance Benefits Check: Does the patient have prescription insurance?  Yes Type of insurance plan: commercial plan - UHC  Outpatient Pharmacy:  Prior to admission outpatient pharmacy: [facility] Is the patient willing to use [facility] TOC pharmacy at discharge? Yes   Assessment: 1. Acute systolic CHF (EF 97-02%), due to NICM. NYHA class II symptoms. cMRI without evidence of myocarditis. - Continue metoprolol XL 25 mg daily - Continue Entresto 24/26 mg BID - Wait until hospital follow up appt to add spironolactone vs Farxiga pending BP and SCr trends    Plan: 1) Medication changes recommended at this time: - Continue current regimen; discharge today  2) Patient assistance: - Qualifies for monthly copay cards since he has Nurse, learning disability - Enrolled him in $10 per fill Entresto copay card with [employer]  3)  Education  - HF discharge education will be completed by Team C Pharmacist  Sharen Hones, PharmD, BCPS Heart Failure Engineer, building services Phone [phone]

## 2020-04-12 NOTE — Progress Notes (Signed)
Heart Failure Patient Advocate Encounter  Was successful in obtaining a copay card for [facility].  This copay card will make the patients copay $10 per fill.  The billing information is as follows and has been shared with the patients pharmacy.   RxBin: [id number] PCN: [id number] Member ID: [id number] Group ID: [id number]  Sharen Hones, PharmD, BCPS Heart Failure Stewardship Pharmacist Phone [phone]  Please check [email or web address] for unit-specific pharmacist phone numbers

## 2020-04-12 NOTE — Progress Notes (Signed)
RN provided patient with verbal discharge instructions. Patient wife at bedside during instructions. RN answered all questions. IV's removed per orders. Pt VSS at discharge and belongings sent with patient and wife. TOC pharmacy brought medication to beside. Pt d/c by NT through [employer] entrance via wheelchair.

## 2020-04-12 NOTE — Discharge Summary (Addendum)
Discharge Summary    Patient ID: Patrick Strickland MRN: [id number]; DOB: [birth year]  Admit date: 04/10/2020 Discharge date: 04/12/2020  Primary Care Provider: Pcp, No  Primary Cardiologist: Nanetta Batty, MD    Discharge Diagnoses    Principal Problem:   Non-ST elevation (NSTEMI) myocardial infarction [facility]) Active Problems:   Chest pain   HLD (hyperlipidemia)   NICM (nonischemic cardiomyopathy) (HCC)   DM (diabetes mellitus) (HCC)   Diagnostic Studies/Procedures     2D echocardiogram (04/11/2018)   IMPRESSIONS     1. Left ventricular ejection fraction, by estimation, is 40 to 45%. The  left ventricle has mildly decreased function. The left ventricle  demonstrates global hypokinesis. Left ventricular diastolic parameters are  consistent with Grade II diastolic  dysfunction (pseudonormalization).   2. Right ventricular systolic function is normal. The right ventricular  size is normal. There is normal pulmonary artery systolic pressure.   3. The mitral valve is normal in structure. Trivial mitral valve  regurgitation. No evidence of mitral stenosis.   4. The aortic valve is tricuspid. Aortic valve regurgitation is not  visualized. No aortic stenosis is present.   5. The inferior vena cava is normal in size with greater than 50%  respiratory variability, suggesting right atrial pressure of 3 mmHg.     Cardiac catheterization (04/11/2020)   Conclusion   Conclusions: Mild, non-obstructive coronary artery disease with 10-20% lesion involving large D1 branch.  There is sluggish flow throughout the coronary bed raising the possibility of microvascular dysfunction. Mildly to moderately reduced left ventricular systolic function with mid and apical anterior hypokinesis. Normal left ventricular filling pressure.   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  Question myocarditis versus unusual variant  of stress-induced cardiomyopathy; cardiac MRI may be helpful. Medical therapy and risk factor modification to prevent progression of mild coronary artery disease.   Yvonne Kendall, MD [facility]   History of Present Illness     Patrick Strickland is a 50 y.o. male with HLD and DMII who presented to OSH with acute onset chest pain and was found to have an NSTEMI.    Patient developed acute onset substernal chest pain around 1400 on 04/09/20. He described it as a persistent "gas bubble sensation" with associated chest tightness, dizziness, nausea, and diaphoresis. There were no clear exacerbating or alleviating factors. Given his symptoms, he presented to an [facility] ED for evaluation. EKG was reportedly without any dynamic ST changes. Troponin I was found to be elevated 0.10 > 0.10 > 0.24. He was started on a heparin gtt and plans were made for transfer.   Hospital Course     Consultants: None  1. NSTEMI - Hs troponin peaked at 3104. Treated with IV heparin. Echo showed LVEF of 40-45%, global hypokinesis, grade II DD. Cardiac catheterization revealed normal coronary arteries with moderate LV dysfunction.  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  Cardiac MRI showed no evidence of myocarditis. Continue ASA, Statin and BB.   2. Non-ischemic cardiomyopathy - Echo showed LVEF of 40-45%, global hypokinesis, grade II DD. Cardiac catheterization revealed normal coronary arteries with moderate LV dysfunction.  Cardiac MRI showed no evidence of myocarditis. No over heart failure on exam. Continue BB and Entresto. Repeat echo in 3 months.   3. HLD - 04/11/2020: Cholesterol 160; HDL 41; LDL Cholesterol 88; Triglycerides 153; VLDL 31  - Continue statin  4. DM - HgbA1c 6.5 - On Metformin   Did the  patient have an acute coronary syndrome (MI, NSTEMI, STEMI, etc) this admission?:  No>> unknown demand ischemia  The elevated Troponin was due to the acute medical illness (demand ischemia).    Discharge Vitals Blood pressure 113/72, pulse 75, temperature (!) 96.7 F (35.9 C), temperature source Oral, resp. rate 18, height 5\' 10"  (1.778 m), weight 112.8 kg, SpO2 100 %.  Filed Weights   04/10/20 1923 04/11/20 0300 04/12/20 0334  Weight: 114.6 kg 114.6 kg 112.8 kg    Labs & Radiologic Studies    CBC Recent Labs    04/11/20 0610  WBC 8.2  HGB 16.7  HCT 48.4  MCV 84.5  PLT 0000000   Basic Metabolic Panel Recent Labs    04/10/20 2011 04/11/20 0610  NA 137 135  K 3.6 4.0  CL 100 102  CO2 25 21*  GLUCOSE 103* 103*  BUN 8 8  CREATININE 0.95 0.79  CALCIUM 9.0 8.9  MG 2.4  --    Liver Function Tests Recent Labs    04/10/20 2011  AST 26  ALT 34  ALKPHOS 57  BILITOT 0.8  PROT 7.0  ALBUMIN 3.6   High Sensitivity Troponin:   Recent Labs  Lab 04/10/20 2011 04/10/20 2157 04/11/20 0610 04/11/20 0729  TROPONINIHS 524* 971* 2,798* 3,104*   Hemoglobin A1C Recent Labs    04/11/20 0618  HGBA1C 6.5*   Fasting Lipid Panel Recent Labs    04/11/20 0610  CHOL 160  HDL 41  LDLCALC 88  TRIG 153*  CHOLHDL 3.9  _____________  CARDIAC CATHETERIZATION  Result Date: 04/11/2020 Conclusions: 1. Mild, non-obstructive coronary artery disease with 10-20% lesion involving large D1 branch.  There is sluggish flow throughout the coronary bed raising the possibility of microvascular dysfunction. 2. Mildly to moderately reduced left ventricular systolic function with mid and apical anterior hypokinesis. 3. Normal left ventricular filling pressure. Recommendations: 1. 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  Question myocarditis versus unusual variant of stress-induced cardiomyopathy; cardiac MRI may be helpful. 2. Medical therapy and risk factor modification to prevent progression of mild coronary artery disease. Nelva Bush, MD [facility]   MR CARDIAC MORPHOLOGY W WO CONTRAST  Result Date:  04/11/2020 CLINICAL DATA:  39M presents with MINOCA EXAM: CARDIAC MRI TECHNIQUE: The patient was scanned on a 1.5 Tesla Siemens magnet. A dedicated cardiac coil was used. Functional imaging was done using Fiesta sequences. 2,3, and 4 chamber views were done to assess for RWMA's. Modified Simpson's rule using a short axis stack was used to calculate an ejection fraction on a dedicated work Conservation officer, nature. The patient received 13cc of Gadavist. After 10 minutes inversion recovery sequences were used to assess for infiltration and scar tissue. CONTRAST:  13 cc  of Gadavist FINDINGS: Left ventricle: -Normal size -Mild systolic dysfunction -Normal native T1,T2, and ECV -No LGE LV EF: 49% (Normal 56-78%) Absolute volumes: LV EDV: 143mL (Normal 77-195 mL) LV ESV: 85mL (Normal 19-72 mL) LV SV: 78mL (Normal 51-133 mL) CO: 5.8L/min (Normal 2.8-8.8 L/min) Indexed volumes: LV EDV: 62mL/sq-m (Normal 47-92 mL/sq-m) LV ESV: 91mL/sq-m (Normal 13-30 mL/sq-m) LV SV: 78mL/sq-m (Normal 32-62 mL/sq-m) CI: 2.4L/min/sq-m (Normal 1.7-4.2 L/min/sq-m) Right ventricle: Normal size with low normal systolic function RV EF:  47% (Normal 47-74%) Absolute volumes: RV EDV: 128mL (Normal 88-227 mL) RV ESV: 38mL (Normal 23-103 mL) RV SV: 27mL (Normal 52-138 mL) CO: 5.6L/min (Normal 2.8-8.8 L/min) Indexed volumes: RV EDV: 74mL/sq-m (Normal 55-105 mL/sq-m) RV ESV:  42mL/sq-m (Normal 15-43 mL/sq-m) RV SV: 70mL/sq-m (Normal 32-64 mL/sq-m) CI: 2.4L/min/sq-m (Normal 1.7-4.2 L/min/sq-m) Left atrium: Mild enlargement Right atrium: Normal size Mitral valve: Trivial regurgitation Aortic valve: No regurgitation Tricuspid valve: No regurgitation Pulmonic valve: No regurgitation Aorta: Normal proximal ascending aorta Pericardium: Normal IMPRESSION: 1.  No evidence of myocarditis with normal native T1, T2, and ECV 2.  No late gadolinium enhancement to suggest myocardial scar 3.  Normal LV size with mild systolic dysfunction (EF 99991111) 4.  Normal RV size  with low normal systolic function (EF 99991111) Electronically Signed   By: Oswaldo Milian MD   On: 04/11/2020 22:30   ECHOCARDIOGRAM COMPLETE  Result Date: 04/11/2020    ECHOCARDIOGRAM REPORT   Patient Name:   Patrick Strickland Galer Date of Exam: 04/11/2020 Medical Rec #:  [id number]    Height:       70.0 in Accession #:    [id number]   Weight:       252.6 lb Date of Birth:  [birth year]    BSA:          2.305 m Patient Age:    4 years     BP:           124/92 mmHg Patient Gender: M            HR:           76 bpm. Exam Location:  Inpatient Procedure: 2D Echo, Color Doppler, Cardiac Doppler and Intracardiac            Opacification Agent Indications:    R07.9* Chest pain, unspecified  History:        Patient has no prior history of Echocardiogram examinations.                 Risk Factors:Diabetes and Dyslipidemia.  Sonographer:    Raquel Sarna Senior RDCS Referring Phys: [id number] [place]  1. Left ventricular ejection fraction, by estimation, is 40 to 45%. The left ventricle has mildly decreased function. The left ventricle demonstrates global hypokinesis. Left ventricular diastolic parameters are consistent with Grade II diastolic dysfunction (pseudonormalization).  2. Right ventricular systolic function is normal. The right ventricular size is normal. There is normal pulmonary artery systolic pressure.  3. The mitral valve is normal in structure. Trivial mitral valve regurgitation. No evidence of mitral stenosis.  4. The aortic valve is tricuspid. Aortic valve regurgitation is not visualized. No aortic stenosis is present.  5. The inferior vena cava is normal in size with greater than 50% respiratory variability, suggesting right atrial pressure of 3 mmHg. FINDINGS  Left Ventricle: Left ventricular ejection fraction, by estimation, is 40 to 45%. The left ventricle has mildly decreased function. The left ventricle demonstrates global hypokinesis. The left ventricular internal cavity size was normal in size. There  is  no left ventricular hypertrophy. Left ventricular diastolic parameters are consistent with Grade II diastolic dysfunction (pseudonormalization). Indeterminate filling pressures. Right Ventricle: The right ventricular size is normal. No increase in right ventricular wall thickness. Right ventricular systolic function is normal. There is normal pulmonary artery systolic pressure. The tricuspid regurgitant velocity is 2.07 m/s, and  with an assumed right atrial pressure of 3 mmHg, the estimated right ventricular systolic pressure is [id number] mmHg. Left Atrium: Left atrial size was normal in size. Right Atrium: Right atrial size was normal in size. Pericardium: There is no evidence of pericardial effusion. Mitral Valve: The mitral valve is normal in structure. Trivial mitral valve regurgitation. No evidence of mitral valve  stenosis. Tricuspid Valve: The tricuspid valve is normal in structure. Tricuspid valve regurgitation is trivial. No evidence of tricuspid stenosis. Aortic Valve: The aortic valve is tricuspid. Aortic valve regurgitation is not visualized. No aortic stenosis is present. Pulmonic Valve: The pulmonic valve was normal in structure. Pulmonic valve regurgitation is not visualized. No evidence of pulmonic stenosis. Aorta: The aortic root is normal in size and structure. Venous: The inferior vena cava is normal in size with greater than 50% respiratory variability, suggesting right atrial pressure of 3 mmHg. IAS/Shunts: No atrial level shunt detected by color flow Doppler.  LEFT VENTRICLE PLAX 2D LVIDd:         4.80 cm  Diastology LVIDs:         2.90 cm  LV e' medial:    7.07 cm/s LV PW:         1.00 cm  LV E/e' medial:  10.5 LV IVS:        1.00 cm  LV e' lateral:   8.38 cm/s LVOT diam:     2.10 cm  LV E/e' lateral: 8.9 LV SV:         60 LV SV Index:   26 LVOT Area:     3.46 cm  RIGHT VENTRICLE RV S prime:     8.59 cm/s TAPSE (M-mode): 1.9 cm LEFT ATRIUM             Index       RIGHT ATRIUM           Index  LA diam:        2.90 cm 1.26 cm/m  RA Area:     15.10 cm LA Vol (A2C):   68.3 ml 29.63 ml/m RA Volume:   36.80 ml  15.96 ml/m LA Vol (A4C):   50.2 ml 21.78 ml/m LA Biplane Vol: 58.7 ml 25.47 ml/m  AORTIC VALVE LVOT Vmax:   87.40 cm/s LVOT Vmean:  60.500 cm/s LVOT VTI:    0.172 m  AORTA Ao Root diam: 3.10 cm Ao Asc diam:  3.20 cm MITRAL VALVE               TRICUSPID VALVE MV Area (PHT): 3.99 cm    TR Peak grad:   17.1 mmHg MV Decel Time: 190 msec    TR Vmax:        207.00 cm/s MV E velocity: 74.50 cm/s MV A velocity: 58.70 cm/s  SHUNTS MV E/A ratio:  1.27        Systemic VTI:  0.17 m                            Systemic Diam: 2.10 cm Skeet Latch MD Electronically signed by Skeet Latch MD Signature Date/Time: 04/11/2020/12:15:13 PM    Final    Disposition   Pt is being discharged home today in good condition.  Follow-up Plans & Appointments     Follow-up Information     Ledora Bottcher, [place]. Go on 04/25/2020.   Specialties: Physician Assistant, Cardiology, Radiology Why: @9 :15am for hospital follow up with Dr. Kennon Holter PA Contact information: [place] [address] [phone]         Lorretta Harp, MD. Go on 06/03/2020.   Specialties: Cardiology, Radiology Why: 10:30am for routine follow up  Contact information: [place] [address] [phone]  Discharge Instructions     Diet - low sodium heart healthy   Complete by: As directed    Discharge instructions   Complete by: As directed    NO HEAVY LIFTING (>10lbs) X 1 WEEK.  NO SEXUAL ACTIVITY X 2 WEEKS. NO DRIVING X 3 DAYS.  NO SOAKING BATHS, HOT TUBS, POOLS, ETC., X 7 DAYS.  Hold Metformin today and tomorrow. Resume 04/14/20.   Increase activity slowly   Complete by: As directed        Discharge Medications   Allergies as of 04/12/2020   No Known Allergies      Medication List     STOP taking these medications     clindamycin 1 % external solution Commonly known as: CLEOCIN T   naproxen sodium 220 MG tablet Commonly known as: ALEVE   sulfamethoxazole-trimethoprim 800-160 MG tablet Commonly known as: BACTRIM DS       TAKE these medications    aspirin EC 81 MG tablet Take 1 tablet (81 mg total) by mouth daily. What changed: additional instructions   metFORMIN 500 MG 24 hr tablet Commonly known as: GLUCOPHAGE-XR Take 500 mg by mouth daily.   metoprolol succinate 25 MG 24 hr tablet Commonly known as: TOPROL-XL Take 1 tablet (25 mg total) by mouth daily. Start taking on: April 13, 2020   omega-3 acid ethyl esters 1 g capsule Commonly known as: LOVAZA Take 2 capsules by mouth 2 (two) times daily.   rosuvastatin 20 MG tablet Commonly known as: CRESTOR Take 20 mg by mouth at bedtime.   sacubitril-valsartan 24-26 MG Commonly known as: ENTRESTO Take 1 tablet by mouth 2 (two) times daily.         Outstanding Labs/Studies   BMET as outpatient   Duration of Discharge Encounter   Greater than 30 minutes including physician time.  Signed, Leanor Kail, PA 04/12/2020, 10:01 AM  Agree with note by Robbie Lis PA-C  Patient stable for discharge.  Coronary arteries were free of significant disease.  Cardiac MRI was normal as well.  On appropriate medications including beta-blocker and Entresto.  We will arrange outpatient follow-up.  Lorretta Harp, M.D., [place], [facility], [place] [address] [place], [place]  [address]  [phone] 04/12/2020 10:33 AM

## 2020-04-25 ENCOUNTER — Ambulatory Visit: Payer: Commercial Managed Care - PPO | Admitting: Physician Assistant

## 2020-05-05 ENCOUNTER — Ambulatory Visit (INDEPENDENT_AMBULATORY_CARE_PROVIDER_SITE_OTHER): Payer: Commercial Managed Care - PPO | Admitting: Physician Assistant

## 2020-05-05 ENCOUNTER — Other Ambulatory Visit: Payer: Self-pay

## 2020-05-05 VITALS — BP 110/54 | HR 72 | Ht 70.0 in | Wt 254.6 lb

## 2020-05-05 DIAGNOSIS — E785 Hyperlipidemia, unspecified: Secondary | ICD-10-CM

## 2020-05-05 DIAGNOSIS — E119 Type 2 diabetes mellitus without complications: Secondary | ICD-10-CM

## 2020-05-05 DIAGNOSIS — R9430 Abnormal result of cardiovascular function study, unspecified: Secondary | ICD-10-CM

## 2020-05-05 NOTE — Patient Instructions (Signed)
Medication Instructions:  The current medical regimen is effective;  continue present plan and medications as directed. Please refer to the Current Medication list given to you today.   *If you need a refill on your cardiac medications before your next appointment, please call your pharmacy*  Lab Work:     NONE      Testing/Procedures: LIMITED Echocardiogram - Your physician has requested that you have an echocardiogram. Echocardiography is a painless test that uses sound waves to create images of your heart. It provides your doctor with information about the size and shape of your heart and how well your heart's chambers and valves are working. This procedure takes approximately one hour. There are no restrictions for this procedure. This will be performed at our [facility] location - [address].  Follow-Up: Your next appointment:  KEEP SCHEDULED APPOINTMENT  In Person with Quay Burow, MD   At [facility], you and your health needs are our priority.  As part of our continuing mission to provide you with exceptional heart care, we have created designated Provider Care Teams.  These Care Teams include your primary Cardiologist (physician) and Advanced Practice Providers (APPs -  Physician Assistants and Nurse Practitioners) who all work together to provide you with the care you need, when you need it.  We recommend signing up for the patient portal called "MyChart".  Sign up information is provided on this After Visit Summary.  MyChart is used to connect with patients for Virtual Visits (Telemedicine).  Patients are able to view lab/test results, encounter notes, upcoming appointments, etc.  Non-urgent messages can be sent to your provider as well.   To learn more about what you can do with MyChart, go to [email or web address].

## 2020-05-05 NOTE — Progress Notes (Signed)
Cardiology Office Note:    Date:  05/07/2020   ID:  Patrick Strickland, DOB [birth year], MRN [id number]  PCP:  Laverna Peace, MD  [facility] Cardiologist:  Quay Burow, MD  [place] Electrophysiologist:  None   Referring MD: No ref. provider found   Chief Complaint  Patient presents with  . Hospitalization Follow-up    Seen for Dr. Gwenlyn Found    History of Present Illness:    Patrick Strickland is a 50 y.o. male with a hx of hyperlipidemia and DM2 who recently presented was transferred to [facility] for evaluation of NSTEMI.  Serial troponin trended up to 0.24 from 0.1.  Upon transferring, high-sensitivity troponin peaked at 3104.  He was placed on IV heparin.  Echocardiogram obtained showed EF 40 to 45%, global hypokinesis, grade 2 DD.  Subsequent cardiac catheterization however revealed essentially normal coronary arteries with moderate LVH.  Cardiac MRI showed no evidence of myocarditis.  Patient was placed on beta-blocker, aspirin, statin and Entresto.  Patient presents today for follow-up.  He denies any chest pain or shortness of breath.  He has no lower extremity edema, orthopnea or PND.  He has been compliant with his medication.  Patient does mention she had lab work last week at the PCPs office who informed him that his renal function and electrolyte is normal.  Otherwise he can follow-up with Dr. Gwenlyn Found near the end of February as previously scheduled.  He will need a 37-month echocardiogram to make sure his EF has recovered.  History reviewed. No pertinent past medical history.  Past Surgical History:  Procedure Laterality Date  . LEFT HEART CATH AND CORONARY ANGIOGRAPHY N/A 04/11/2020   Procedure: LEFT HEART CATH AND CORONARY ANGIOGRAPHY;  Surgeon: Nelva Bush, MD;  Location: [facility];  Service: Cardiovascular;  Laterality: N/A;    Current Medications: No outpatient medications have been marked as taking for the 05/05/20 encounter (Office Visit) with Almyra Deforest,  [place].     Allergies:   Patient has no known allergies.   Social History   Socioeconomic History  . Marital status: Married    Spouse name: Not on file  . Number of children: Not on file  . Years of education: Not on file  . Highest education level: Not on file  Occupational History  . Not on file  Tobacco Use  . Smoking status: Never Smoker  . Smokeless tobacco: Current User    Types: Chew  Vaping Use  . Vaping Use: Never used  Substance and Sexual Activity  . Alcohol use: Yes    Alcohol/week: 1.0 standard drink    Types: 1 Cans of beer per week  . Drug use: Not on file  . Sexual activity: Yes  Other Topics Concern  . Not on file  Social History Narrative  . Not on file   Social Determinants of Health   Financial Resource Strain: Not on file  Food Insecurity: Not on file  Transportation Needs: Not on file  Physical Activity: Not on file  Stress: Not on file  Social Connections: Not on file     Family History: The patient's family history is not on file.  ROS:   Please see the history of present illness.     All other systems reviewed and are negative.  EKGs/Labs/Other Studies Reviewed:    The following studies were reviewed today:  Echo 04/11/2020 1. Left ventricular ejection fraction, by estimation, is 40 to 45%. The  left ventricle has  mildly decreased function. The left ventricle  demonstrates global hypokinesis. Left ventricular diastolic parameters are  consistent with Grade II diastolic  dysfunction (pseudonormalization).  2. Right ventricular systolic function is normal. The right ventricular  size is normal. There is normal pulmonary artery systolic pressure.  3. The mitral valve is normal in structure. Trivial mitral valve  regurgitation. No evidence of mitral stenosis.  4. The aortic valve is tricuspid. Aortic valve regurgitation is not  visualized. No aortic stenosis is present.  5. The inferior vena cava is normal in size with greater than  50%  respiratory variability, suggesting right atrial pressure of 3 mmHg.    Cath 04/11/2020 Conclusions: 1. Mild, non-obstructive coronary artery disease with 10-20% lesion involving large D1 branch.  There is sluggish flow throughout the coronary bed raising the possibility of microvascular dysfunction. 2. Mildly to moderately reduced left ventricular systolic function with mid and apical anterior hypokinesis. 3. Normal left ventricular filling pressure.  Recommendations: 1. 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  Question myocarditis versus unusual variant of stress-induced cardiomyopathy; cardiac MRI may be helpful. 2. Medical therapy and risk factor modification to prevent progression of mild coronary artery disease.    EKG:  EKG is not ordered today.    Recent Labs: 04/10/2020: ALT 34; Magnesium 2.4 04/11/2020: BUN 8; Creatinine, Ser 0.79; Hemoglobin 16.7; Platelets 179; Potassium 4.0; Sodium 135  Recent Lipid Panel    Component Value Date/Time   CHOL 160 04/11/2020 0610   TRIG 153 (H) 04/11/2020 0610   HDL 41 04/11/2020 0610   CHOLHDL 3.9 04/11/2020 0610   VLDL 31 04/11/2020 0610   LDLCALC 88 04/11/2020 0610     Risk Assessment/Calculations:       Physical Exam:    VS:  BP (!) 110/54 (BP Location: Left Arm, Patient Position: Sitting, Cuff Size: Normal)   Pulse 72   Ht 5\' 10"  (1.778 m)   Wt 254 lb 9.6 oz (115.5 kg)   BMI 36.53 kg/m     Wt Readings from Last 3 Encounters:  05/05/20 254 lb 9.6 oz (115.5 kg)  04/12/20 248 lb 10.9 oz (112.8 kg)     GEN:  Well nourished, well developed in no acute distress HEENT: Normal NECK: No JVD; No carotid bruits LYMPHATICS: No lymphadenopathy CARDIAC: RRR, no murmurs, rubs, gallops RESPIRATORY:  Clear to auscultation without rales, wheezing or rhonchi  ABDOMEN: Soft, non-tender, non-distended MUSCULOSKELETAL:  No edema; No deformity  SKIN: Warm and  dry NEUROLOGIC:  Alert and oriented x 3 PSYCHIATRIC:  Normal affect   ASSESSMENT:    1. Ejection fraction < 50%   2. Hyperlipidemia LDL goal <100   3. Controlled type 2 diabetes mellitus without complication, without long-term current use of insulin (HCC)    PLAN:    In order of problems listed above:  1. LV dysfunction: Potentially Takotsubo cardiomyopathy however patient denies any recent stressful event.  Continue Toprol-XL and Entresto.  Repeat echocardiogram in 3 months  2. Hyperlipidemia: On Crestor  3. DM2: Managed by primary care provider.  On Metformin.        Medication Adjustments/Labs and Tests Ordered: Current medicines are reviewed at length with the patient today.  Concerns regarding medicines are outlined above.  Orders Placed This Encounter  Procedures  . ECHOCARDIOGRAM LIMITED   No orders of the defined types were placed in this encounter.   Patient Instructions  Medication Instructions:  The current medical regimen is effective;  continue  present plan and medications as directed. Please refer to the Current Medication list given to you today.   *If you need a refill on your cardiac medications before your next appointment, please call your pharmacy*  Lab Work:     NONE      Testing/Procedures: LIMITED Echocardiogram - Your physician has requested that you have an echocardiogram. Echocardiography is a painless test that uses sound waves to create images of your heart. It provides your doctor with information about the size and shape of your heart and how well your heart's chambers and valves are working. This procedure takes approximately one hour. There are no restrictions for this procedure. This will be performed at our [facility] location - [address].  Follow-Up: Your next appointment:  KEEP SCHEDULED APPOINTMENT  In Person with Quay Burow, MD   At [facility], you and your health needs are our priority.  As part of our  continuing mission to provide you with exceptional heart care, we have created designated Provider Care Teams.  These Care Teams include your primary Cardiologist (physician) and Advanced Practice Providers (APPs -  Physician Assistants and Nurse Practitioners) who all work together to provide you with the care you need, when you need it.  We recommend signing up for the patient portal called "MyChart".  Sign up information is provided on this After Visit Summary.  MyChart is used to connect with patients for Virtual Visits (Telemedicine).  Patients are able to view lab/test results, encounter notes, upcoming appointments, etc.  Non-urgent messages can be sent to your provider as well.   To learn more about what you can do with MyChart, go to [email or web address].       Hilbert Corrigan, [place]  05/07/2020 11:52 AM    [facility]

## 2020-05-07 ENCOUNTER — Encounter: Payer: Self-pay | Admitting: Physician Assistant

## 2020-05-23 ENCOUNTER — Other Ambulatory Visit: Payer: Self-pay

## 2020-05-23 ENCOUNTER — Ambulatory Visit (HOSPITAL_COMMUNITY): Payer: Commercial Managed Care - PPO | Attending: Cardiology

## 2020-05-23 DIAGNOSIS — R9430 Abnormal result of cardiovascular function study, unspecified: Secondary | ICD-10-CM | POA: Diagnosis present

## 2020-05-23 LAB — ECHOCARDIOGRAM LIMITED
Area-P 1/2: 3.65 cm2
S' Lateral: 3 cm

## 2020-05-23 MED ORDER — PERFLUTREN LIPID MICROSPHERE
1.0000 mL | INTRAVENOUS | Status: AC | PRN
  Administered 2020-05-23: 2 mL via INTRAVENOUS

## 2020-05-31 ENCOUNTER — Encounter: Payer: Self-pay | Admitting: Physician Assistant

## 2020-06-03 ENCOUNTER — Encounter: Payer: Self-pay | Admitting: Cardiovascular Disease

## 2020-06-03 ENCOUNTER — Other Ambulatory Visit: Payer: Self-pay

## 2020-06-03 ENCOUNTER — Ambulatory Visit (INDEPENDENT_AMBULATORY_CARE_PROVIDER_SITE_OTHER): Payer: Commercial Managed Care - PPO | Admitting: Cardiovascular Disease

## 2020-06-03 DIAGNOSIS — I428 Other cardiomyopathies: Secondary | ICD-10-CM | POA: Diagnosis not present

## 2020-06-03 DIAGNOSIS — E782 Mixed hyperlipidemia: Secondary | ICD-10-CM

## 2020-06-03 NOTE — Progress Notes (Signed)
06/03/2020 [name]   1971-02-23  [id number]  Primary Physician Laverna Peace, MD Primary Cardiologist: Lorretta Harp MD Lupe Carney, [place]  HPI:  Patrick Strickland is a 50 y.o. 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  His primary care provider is [facility].  His cardiac risk factor profile is notable for treated hyperlipidemia and hypertension.  I met him in early January when he was admitted in transfer from [facility] with chest pain and non-STEMI.  He underwent diagnostic coronary angiography by Dr. Saunders Revel revealing no evidence of CAD with "sluggish coronary blood flow".  His EF was in the 40 to 45% range.  Cardiac MRI did not show myocarditis.  He was eventually discharged and placed on Entresto and beta-blocker.  His follow-up 2D echo performed 6 weeks later revealed improvement in his EF up to 55 to 60%.  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  He is completely asymptomatic.   Current Meds  Medication Sig  . aspirin EC 81 MG tablet Take 1 tablet (81 mg total) by mouth daily.  . metFORMIN (GLUCOPHAGE-XR) 500 MG 24 hr tablet Take 500 mg by mouth daily.  . metoprolol succinate (TOPROL-XL) 25 MG 24 hr tablet Take 1 tablet (25 mg total) by mouth daily.  Marland Kitchen omega-3 acid ethyl esters (LOVAZA) 1 g capsule Take 2 capsules by mouth 2 (two) times daily.  . rosuvastatin (CRESTOR) 20 MG tablet Take 20 mg by mouth at bedtime.  . sacubitril-valsartan (ENTRESTO) 24-26 MG Take 1 tablet by mouth 2 (two) times daily.     No Known Allergies  Social History   Socioeconomic History  . Marital status: Married    Spouse name: Not on file  . Number of children: Not on file  . Years of education: Not on file  . Highest education level: Not on file  Occupational History  . Not on file  Tobacco Use  . Smoking status:  Never Smoker  . Smokeless tobacco: Current User    Types: Chew  Vaping Use  . Vaping Use: Never used  Substance and Sexual Activity  . Alcohol use: Yes    Alcohol/week: 1.0 standard drink    Types: 1 Cans of beer per week  . Drug use: Not on file  . Sexual activity: Yes  Other Topics Concern  . Not on file  Social History Narrative   ** Merged History Encounter **       Social Determinants of Health   Financial Resource Strain: Not on file  Food Insecurity: Not on file  Transportation Needs: Not on file  Physical Activity: Not on file  Stress: Not on file  Social Connections: Not on file  Intimate Partner Violence: Not on file     Review of Systems: General: negative for chills, fever, night sweats or weight changes.  Cardiovascular: negative for chest pain, dyspnea on exertion, edema, orthopnea, palpitations, paroxysmal nocturnal dyspnea or shortness of breath Dermatological: negative for rash Respiratory: negative for cough or wheezing Urologic: negative for hematuria Abdominal: negative for nausea, vomiting, diarrhea, bright red blood per rectum, melena, or hematemesis Neurologic: negative for visual changes, syncope, or dizziness All other systems reviewed and are otherwise negative except as noted above.    Blood pressure 115/73, pulse 76, height 5' 10"  (1.778 m), weight 256 lb 6.4 oz (116.3 kg),  SpO2 100 %.  General appearance: alert and no distress Neck: no adenopathy, no carotid bruit, no JVD, supple, symmetrical, trachea midline and thyroid not enlarged, symmetric, no tenderness/mass/nodules Lungs: clear to auscultation bilaterally Heart: regular rate and rhythm, S1, S2 normal, no murmur, click, rub or gallop Extremities: extremities normal, atraumatic, no cyanosis or edema Pulses: 2+ and symmetric Skin: Skin color, texture, turgor normal. No rashes or lesions Neurologic: Alert and oriented X 3, normal strength and tone. Normal symmetric reflexes. Normal  coordination and gait  EKG not performed today  ASSESSMENT AND PLAN:   NICM (nonischemic cardiomyopathy) ([place]) History of recent admission with non-STEMI and catheter showed clean coronary arteries with "sluggish coronary blood flow", cardiac MRI did not show myocarditis.  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  His EF improved from the 45% range of the 55 to 60% 6 weeks later by 2D echo.  He is on appropriate medical therapy.  He is totally asymptomatic.  HLD (hyperlipidemia) History of hyperlipidemia on statin therapy with lipid profile performed 04/11/2020 revealing cholesterol 160, LDL of 88 and HDL 41.      Lorretta Harp MD FACP,FACC,FAHA, [facility] 06/03/2020 10:53 AM

## 2020-06-03 NOTE — Assessment & Plan Note (Signed)
History of hyperlipidemia on statin therapy with lipid profile performed 04/11/2020 revealing cholesterol 160, LDL of 88 and HDL 41.

## 2020-06-03 NOTE — Assessment & Plan Note (Signed)
History of recent admission with non-STEMI and catheter showed clean coronary arteries with "sluggish coronary blood flow", cardiac MRI did not show myocarditis.  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  His EF improved from the 45% range of the 55 to 60% 6 weeks later by 2D echo.  He is on appropriate medical therapy.  He is totally asymptomatic.

## 2020-06-03 NOTE — Patient Instructions (Signed)

## 2020-06-10 ENCOUNTER — Telehealth: Payer: Self-pay | Admitting: Cardiovascular Disease

## 2020-06-10 NOTE — Telephone Encounter (Signed)
Called the patient to let him know we would send the message to Dr. Kennon Holter nurse to see if she has it.

## 2020-06-10 NOTE — Telephone Encounter (Signed)
    Pt is following up the paperwork he left in the office last Friday for Dr. Gwenlyn Found to fill up. He said it's for [employer] policy for critical claim. He just want to check in if its ready for him to pick it up

## 2020-06-13 NOTE — Telephone Encounter (Signed)
Pt informed that paperwork has been filled out and is ready to be pick up. Pt is very appreciative for the call and filling out his papers.

## 2020-11-07 ENCOUNTER — Other Ambulatory Visit: Payer: Self-pay | Admitting: Physician Assistant

## 2020-11-28 ENCOUNTER — Other Ambulatory Visit: Payer: Self-pay | Admitting: Physician Assistant

## 2021-08-09 ENCOUNTER — Other Ambulatory Visit: Payer: Self-pay | Admitting: Cardiovascular Disease

## 2021-10-17 ENCOUNTER — Other Ambulatory Visit: Payer: Self-pay

## 2021-10-17 MED ORDER — ENTRESTO 24-26 MG PO TABS: 1.0000 | ORAL_TABLET | Freq: Two times a day (BID) | ORAL | 1 refills | Status: DC

## 2021-12-27 ENCOUNTER — Other Ambulatory Visit: Payer: Self-pay

## 2021-12-27 MED ORDER — ENTRESTO 24-26 MG PO TABS: 1.0000 | ORAL_TABLET | Freq: Two times a day (BID) | ORAL | 0 refills | Status: DC

## 2022-01-23 IMAGING — MR MR CARD MORPHOLOGY WO/W CM
45 of 48 series · 45 of 48 positions shown · IV contrast (gadavist)
Comparison: none

CLINICAL DATA: 49M presents with MINOCA

EXAM:
CARDIAC MRI
TECHNIQUE: The patient was scanned on a 1.5 Tesla Siemens magnet. A dedicated
cardiac coil was used. Functional imaging was done using Fiesta
sequences. [DATE], and 4 chamber views were done to assess for RWMA's.
Modified Farabi rule using a short axis stack was used to
calculate an ejection fraction on a dedicated work station using
Circle software. The patient received 13cc of Gadavist. After 10
minutes inversion recovery sequences were used to assess for
infiltration and scar tissue.
CONTRAST:  13 cc  of Gadavist

[Series 4: [id number]_bh · axial · 8.0mm · 1.56mm/px · 1 of 16 slices shown]
[im 1/16]
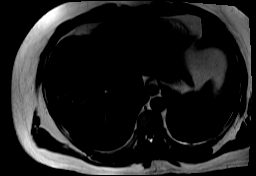

[Series 8: bSSFP · coronal · 8.0mm · 1.79mm/px · 1 of 25 slices shown (1 of 22)]
[im 1/25]
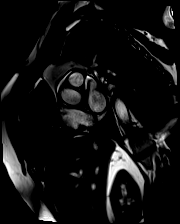

[Series 9: bSSFP · coronal · 8.0mm · 1.79mm/px · 1 of 25 slices shown (2 of 22)]
[im 1/25]
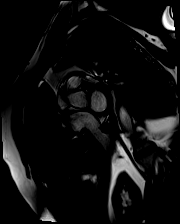

[Series 10: bSSFP · coronal · 8.0mm · 1.79mm/px · 1 of 25 slices shown (3 of 22)]
[im 1/25]
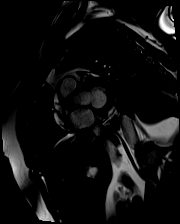

[Series 11: bSSFP · coronal · 8.0mm · 1.79mm/px · 1 of 25 slices shown (4 of 22)]
[im 1/25]
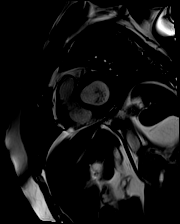

[Series 12: bSSFP · coronal · 8.0mm · 1.79mm/px · 1 of 25 slices shown (5 of 22)]
[im 1/25]
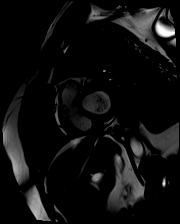

[Series 13: bSSFP · coronal · 8.0mm · 1.79mm/px · 1 of 25 slices shown (6 of 22)]
[im 1/25]
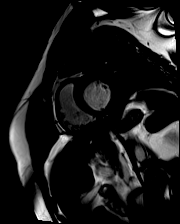

[Series 14: bSSFP · coronal · 8.0mm · 1.79mm/px · 1 of 25 slices shown (7 of 22)]
[im 1/25]
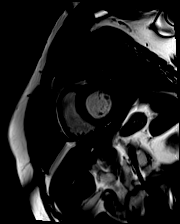

[Series 15: bSSFP · coronal · 8.0mm · 1.79mm/px · 1 of 25 slices shown (8 of 22)]
[im 1/25]
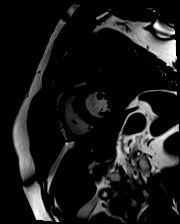

[Series 16: bSSFP · coronal · 8.0mm · 1.79mm/px · 1 of 25 slices shown (9 of 22)]
[im 1/25]
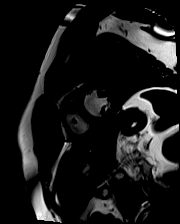

[Series 17: bSSFP · coronal · 8.0mm · 1.79mm/px · 1 of 25 slices shown (10 of 22)]
[im 1/25]
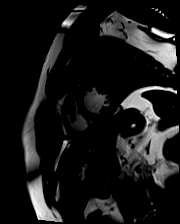

[Series 18: bSSFP · coronal · 8.0mm · 1.79mm/px · 1 of 25 slices shown (11 of 22)]
[im 1/25]
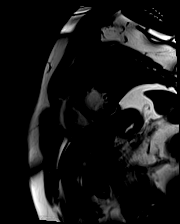

[Series 19: bSSFP · coronal · 8.0mm · 1.79mm/px · 1 of 25 slices shown (12 of 22)]
[im 1/25]
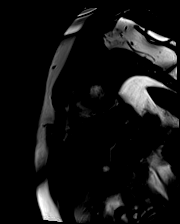

[Series 20: bSSFP · coronal · 8.0mm · 1.79mm/px · 1 of 25 slices shown (13 of 22)]
[im 1/25]
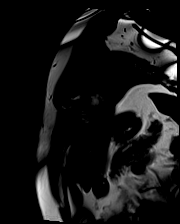

[Series 21: bSSFP · coronal · 8.0mm · 1.79mm/px · 1 of 25 slices shown (14 of 22)]
[im 1/25]
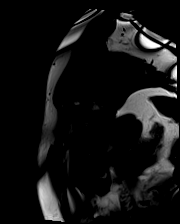

[Series 22: bSSFP · coronal · 8.0mm · 1.79mm/px · 1 of 25 slices shown (15 of 22)]
[im 1/25]
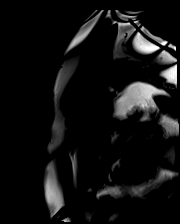

[Series 23: bSSFP · coronal · 8.0mm · 1.79mm/px · 1 of 25 slices shown (16 of 22)]
[im 1/25]
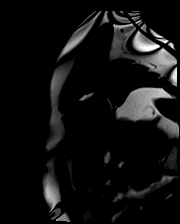

[Series 24: bSSFP · coronal · 8.0mm · 1.79mm/px · 1 of 25 slices shown (17 of 22)]
[im 1/25]
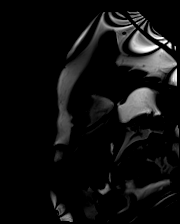

[Series 25: bSSFP · coronal · 8.0mm · 1.79mm/px · 1 of 25 slices shown (18 of 22)]
[im 1/25]
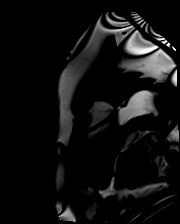

[Series 26: STIR · coronal · 8.0mm · 2.02mm/px · 1 of 17 slices shown]
[im 1/17]
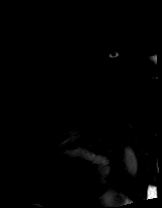

[Series 27: (id)_long_t1 · coronal · 8.0mm · 1.56mm/px · 1 of 24 slices shown]
[im 1/24]
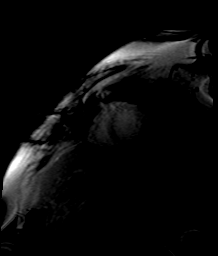

[Series 28: (id)_long_t1_moco · coronal · 8.0mm · 1.56mm/px · 1 of 24 slices shown]
[im 1/24]
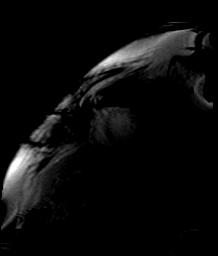

[Series 29: (id)_long_t1_moco_t1 · 1 of 3 slices shown]
[im 1/3]
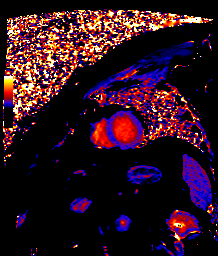

[Series 31: (id)_trufi · coronal · 8.0mm · 2.08mm/px · 1 of 9 slices shown]
[im 1/9]
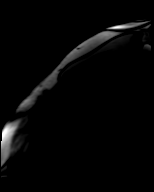

[Series 32: (id)_trufi_moco · coronal · 8.0mm · 2.08mm/px · 1 of 9 slices shown]
[im 1/9]
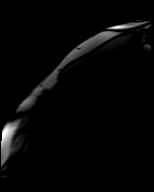

[Series 33: (id)_trufi_moco_t2 · coronal · 8.0mm · 2.08mm/px · 1 of 3 slices shown]
[im 1/3]
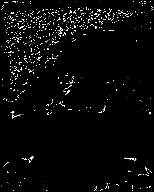

[Series 35: bSSFP · oblique · 6.0mm · 1.48mm/px · 1 of 25 slices shown (19 of 22)]
[im 1/25]
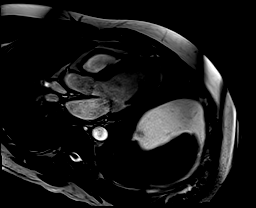

[Series 37: bSSFP · axial · 6.0mm · 1.41mm/px · 1 of 25 slices shown (20 of 22)]
[im 1/25]
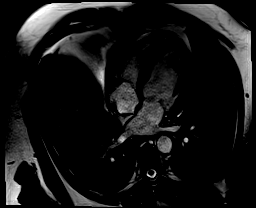

[Series 38: bSSFP · sagittal · 6.0mm · 1.48mm/px · 1 of 25 slices shown (21 of 22)]
[im 1/25]
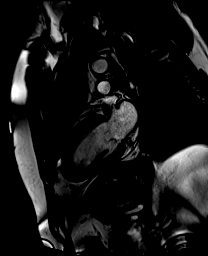

[Series 39: pre short axis · coronal · non-contrast · 8.0mm · 2.25mm/px · 1 of 10 slices shown (1 of 6)]
[im 1/10]
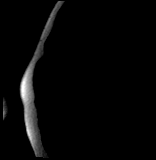

[Series 40: pre short axis · coronal · non-contrast · 8.0mm · 2.25mm/px · 1 of 10 slices shown (2 of 6)]
[im 1/10]
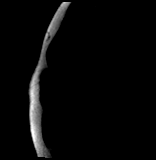

[Series 41: pre short axis · coronal · non-contrast · 8.0mm · 2.25mm/px · 1 of 10 slices shown (3 of 6)]
[im 1/10]
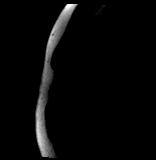

[Series 42: pre short axis · coronal · non-contrast · 8.0mm · 2.25mm/px · 1 of 10 slices shown (4 of 6)]
[im 1/10]
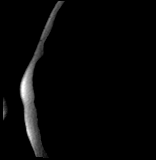

[Series 43: pre short axis · coronal · non-contrast · 8.0mm · 2.25mm/px · 1 of 10 slices shown (5 of 6)]
[im 1/10]
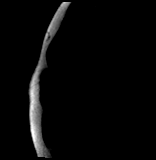

[Series 44: pre short axis · coronal · non-contrast · 8.0mm · 2.25mm/px · 1 of 10 slices shown (6 of 6)]
[im 1/10]
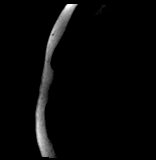

[Series 45: rest short axis · coronal · 8.0mm · 2.25mm/px · 1 of 80 slices shown (1 of 6)]
[im 1/80]
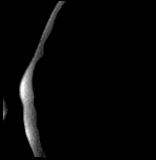

[Series 46: rest short axis · coronal · 8.0mm · 2.25mm/px · 1 of 80 slices shown (2 of 6)]
[im 1/80]
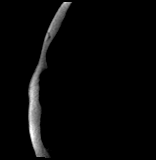

[Series 47: rest short axis · coronal · 8.0mm · 2.25mm/px · 1 of 80 slices shown (3 of 6)]
[im 1/80]
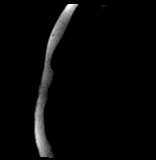

[Series 48: rest short axis · coronal · 8.0mm · 2.25mm/px · 1 of 80 slices shown (4 of 6)]
[im 1/80]
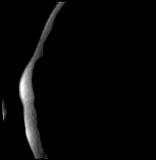

[Series 49: rest short axis · coronal · 8.0mm · 2.25mm/px · 1 of 80 slices shown (5 of 6)]
[im 1/80]
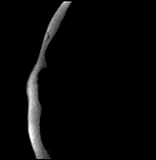

[Series 50: rest short axis · coronal · 8.0mm · 2.25mm/px · 1 of 80 slices shown (6 of 6)]
[im 1/80]
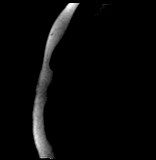

[Series 51: bSSFP · coronal · 6.0mm · 1.41mm/px · 1 of 25 slices shown (22 of 22)]
[im 1/25]
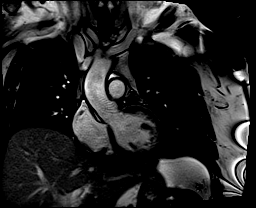

[Series 52: aortic valve cine · oblique · 6.0mm · 1.41mm/px · 1 of 25 slices shown]
[im 1/25]
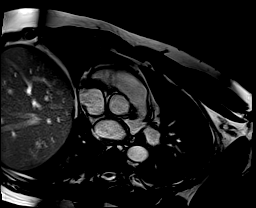

[Series 53: cine rvit · oblique · 6.0mm · 1.41mm/px · 1 of 25 slices shown]
[im 1/25]
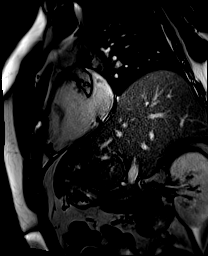

[Series 54: cine rvot · sagittal · 6.0mm · 1.41mm/px · 1 of 25 slices shown]
[im 1/25]
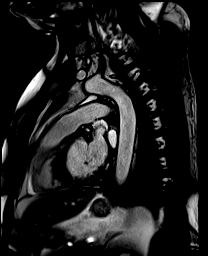

[45 of 48 positions shown; findings below may reference images not displayed]

FINDINGS: Left ventricle:

-Normal size

-Mild systolic dysfunction

-Normal native T1,T2, and ECV

-No LGE

LV EF: 49% (Normal 56-78%)

Absolute volumes:

LV EDV: 162mL (Normal 77-195 mL)

LV ESV: 82mL (Normal 19-72 mL)

LV SV: 79mL (Normal 51-133 mL)

CO: 5.8L/min (Normal 2.8-8.8 L/min)

Indexed volumes:

LV EDV: 68mL/sq-m (Normal 47-92 mL/sq-m)

LV ESV: 35mL/sq-m (Normal 13-30 mL/sq-m)

LV SV: 33mL/sq-m (Normal 32-62 mL/sq-m)

CI: 2.4L/min/sq-m (Normal 1.7-4.2 L/min/sq-m)

Right ventricle: Normal size with low normal systolic function

RV EF:  47% (Normal 47-74%)

Absolute volumes:

RV EDV: 162mL (Normal 88-227 mL)

RV ESV: 85mL (Normal 23-103 mL)

RV SV: 77mL (Normal 52-138 mL)

CO: 5.6L/min (Normal 2.8-8.8 L/min)

Indexed volumes:

RV EDV: 68mL/sq-m (Normal 55-105 mL/sq-m)

RV ESV: 36mL/sq-m (Normal 15-43 mL/sq-m)

RV SV: 32mL/sq-m (Normal 32-64 mL/sq-m)

CI: 2.4L/min/sq-m (Normal 1.7-4.2 L/min/sq-m)

Left atrium: Mild enlargement

Right atrium: Normal size

Mitral valve: Trivial regurgitation

Aortic valve: No regurgitation

Tricuspid valve: No regurgitation

Pulmonic valve: No regurgitation

Aorta: Normal proximal ascending aorta

Pericardium: Normal
IMPRESSION: 1.  No evidence of myocarditis with normal native T1, T2, and ECV

2.  No late gadolinium enhancement to suggest myocardial scar

3.  Normal LV size with mild systolic dysfunction (EF 49%)

4.  Normal RV size with low normal systolic function (EF 47%)

## 2022-04-25 ENCOUNTER — Telehealth: Payer: Self-pay | Admitting: Cardiovascular Disease

## 2022-04-25 MED ORDER — ENTRESTO 24-26 MG PO TABS: 1.0000 | ORAL_TABLET | Freq: Two times a day (BID) | ORAL | 0 refills | Status: DC

## 2022-04-25 NOTE — Telephone Encounter (Signed)
*  STAT* If patient is at the pharmacy, call can be transferred to refill team.   1. Which medications need to be refilled? (please list name of each medication and dose if known)  ENTRESTO 24-26 MG   2. Which pharmacy/location (including street and city if local pharmacy) is medication to be sent to?  [facility] - [place], [place]    3. Do they need a 30 day or 90 day supply? 30 day  Patient is completely out of medication. He has an appointment 2/21.

## 2022-05-30 ENCOUNTER — Ambulatory Visit: Payer: Commercial Managed Care - PPO | Attending: Cardiovascular Disease | Admitting: Cardiovascular Disease

## 2022-05-30 ENCOUNTER — Encounter: Payer: Self-pay | Admitting: Cardiovascular Disease

## 2022-05-30 VITALS — BP 112/72 | HR 90 | Ht 71.0 in | Wt 260.0 lb

## 2022-05-30 DIAGNOSIS — E782 Mixed hyperlipidemia: Secondary | ICD-10-CM | POA: Diagnosis not present

## 2022-05-30 DIAGNOSIS — I428 Other cardiomyopathies: Secondary | ICD-10-CM | POA: Diagnosis not present

## 2022-05-30 DIAGNOSIS — G4733 Obstructive sleep apnea (adult) (pediatric): Secondary | ICD-10-CM | POA: Diagnosis not present

## 2022-05-30 NOTE — Assessment & Plan Note (Signed)
History of hyperlipidemia on statin therapy with lipid profile performed 03/29/2022 revealing total cholesterol 52, LDL of 32 and HDL 23.

## 2022-05-30 NOTE — Assessment & Plan Note (Signed)
On CPAP which he benefits from

## 2022-05-30 NOTE — Patient Instructions (Signed)

## 2022-05-30 NOTE — Progress Notes (Signed)
05/30/2022 Patrick Strickland   1970-08-30  MD:[id number]  Primary Physician Laverna Peace, MD Primary Cardiologist: Lorretta Harp MD Lupe Carney, [place]  HPI:  Patrick Strickland is a 52 y.o.  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 His primary care provider is [facility] [place].  I last saw him in the office 06/03/2020.  His cardiac risk factor profile is notable for treated hyperlipidemia and hypertension. I met him in early January when he was admitted in transfer from [facility] with chest pain and non-STEMI. He underwent diagnostic coronary angiography by Dr. Saunders Revel revealing no evidence of CAD with "sluggish coronary blood flow". His EF was in the 40 to 45% range. Cardiac MRI did not show myocarditis. He was eventually discharged and placed on Entresto and beta-blocker. His follow-up 2D echo performed 6 weeks later revealed improvement in his EF up to 55 to 60%. 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   Since I saw him 2 years ago he is remained stable.  He does have obstructive sleep apnea on CPAP.  He denies chest pain or shortness of breath.  He remains on guideline directed medical therapy.   Current Meds  Medication Sig   aspirin EC 81 MG tablet Take 1 tablet (81 mg total) by mouth daily.   ENTRESTO 24-26 MG Take 1 tablet by mouth 2 (two) times daily.   escitalopram (LEXAPRO) 20 MG tablet Take 20 mg by mouth at bedtime.   JARDIANCE 25 MG TABS tablet Take 25 mg by mouth daily.   metoprolol succinate (TOPROL-XL) 25 MG 24 hr tablet TAKE 1 TABLET BY MOUTH DAILY   omega-3 acid ethyl esters (LOVAZA) 1 g capsule Take 2 capsules by mouth 2 (two) times daily.   OZEMPIC, 1 MG/DOSE, 4 MG/3ML SOPN Inject 1 mg into the skin once a week.   rosuvastatin (CRESTOR) 20 MG tablet Take 20 mg by mouth at bedtime.   testosterone cypionate  (DEPOTESTOSTERONE CYPIONATE) 200 MG/ML injection Inject 200 mg into the muscle every 14 (fourteen) days.     No Known Allergies  Social History   Socioeconomic History   Marital status: Married    Spouse name: Not on file   Number of children: Not on file   Years of education: Not on file   Highest education level: Not on file  Occupational History   Not on file  Tobacco Use   Smoking status: Never   Smokeless tobacco: Current    Types: Chew  Vaping Use   Vaping Use: Never used  Substance and Sexual Activity   Alcohol use: Yes    Alcohol/week: 1.0 standard drink of alcohol    Types: 1 Cans of beer per week   Drug use: Not on file   Sexual activity: Yes  Other Topics Concern   Not on file  Social History Narrative   ** Merged History Encounter **       Social Determinants of Health   Financial Resource Strain: Not on file  Food Insecurity: Not on file  Transportation Needs: Not on file  Physical Activity: Not on file  Stress: Not on file  Social Connections: Not on file  Intimate Partner Violence: Not on file     Review of Systems: General: negative for chills, fever, night sweats or weight changes.  Cardiovascular: negative for chest pain, dyspnea  on exertion, edema, orthopnea, palpitations, paroxysmal nocturnal dyspnea or shortness of breath Dermatological: negative for rash Respiratory: negative for cough or wheezing Urologic: negative for hematuria Abdominal: negative for nausea, vomiting, diarrhea, bright red blood per rectum, melena, or hematemesis Neurologic: negative for visual changes, syncope, or dizziness All other systems reviewed and are otherwise negative except as noted above.    Blood pressure 112/72, pulse 90, height 5' 11"$  (1.803 m), weight 260 lb (117.9 kg), SpO2 96 %.  General appearance: alert and no distress Neck: no adenopathy, no carotid bruit, no JVD, supple, symmetrical, trachea midline, and thyroid not enlarged, symmetric, no  tenderness/mass/nodules Lungs: clear to auscultation bilaterally Heart: regular rate and rhythm, S1, S2 normal, no murmur, click, rub or gallop Extremities: extremities normal, atraumatic, no cyanosis or edema Pulses: 2+ and symmetric Skin: Skin color, texture, turgor normal. No rashes or lesions Neurologic: Grossly normal  EKG sinus rhythm at 90 with left axis deviation.  I personally reviewed this EKG.  ASSESSMENT AND PLAN:   HLD (hyperlipidemia) History of hyperlipidemia on statin therapy with lipid profile performed 03/29/2022 revealing total cholesterol 52, LDL of 32 and HDL 23.  NICM (nonischemic cardiomyopathy) ([place]) History of nonischemic cardiomyopathy in the setting of non-STEMI with cardiac cath per Dr. Saunders Revel revealing no evidence of CAD with "sluggish coronary blood flow".  His EF was in the 40 to 45% range.  Cardiac MRI did not show myocarditis.  He was placed on guideline directed medical therapy, 2D 2D echo done and follow-up 05/23/2020 revealed normalization of LV function.  He denies chest pain or shortness of breath.  It was felt that he had "a Takotsubo variant".  Obstructive sleep apnea On CPAP which he benefits from     Lorretta Harp MD [facility], [facility] 05/30/2022 9:25 AM

## 2022-05-30 NOTE — Assessment & Plan Note (Signed)
History of nonischemic cardiomyopathy in the setting of non-STEMI with cardiac cath per Dr. Saunders Revel revealing no evidence of CAD with "sluggish coronary blood flow".  His EF was in the 40 to 45% range.  Cardiac MRI did not show myocarditis.  He was placed on guideline directed medical therapy, 2D 2D echo done and follow-up 05/23/2020 revealed normalization of LV function.  He denies chest pain or shortness of breath.  It was felt that he had "a Takotsubo variant".

## 2022-05-31 ENCOUNTER — Telehealth: Payer: Self-pay | Admitting: Gastroenterology

## 2022-05-31 NOTE — Telephone Encounter (Signed)
Patients wife called wanting to schedule patient for a colonoscopy. Patient has history with Dr. Orlena Sheldon and had his last colonoscopy 5 years ago. Requesting transfer of care to Dr. Lyndel Safe because Dr. Melina Copa has retired and because his father is a patient of Dr. Steve Rattler. Provided fax number to wife to have records faxed.

## 2022-06-08 NOTE — Telephone Encounter (Signed)
Good Afternoon Dr. Lyndel Safe,   We have received records from [facility], Dr. Marisa Hua office from patients last colonoscopy in July of 2018 that includes his pathology report. I will be sending patients records for you to view, will you please review and advise on scheduling patient?  Thank you.

## 2022-06-14 ENCOUNTER — Other Ambulatory Visit: Payer: Self-pay | Admitting: Cardiovascular Disease

## 2022-06-14 ENCOUNTER — Telehealth: Payer: Self-pay | Admitting: Cardiovascular Disease

## 2022-06-14 MED ORDER — ENTRESTO 24-26 MG PO TABS: 1.0000 | ORAL_TABLET | Freq: Two times a day (BID) | ORAL | 3 refills | Status: DC

## 2022-06-14 NOTE — Telephone Encounter (Signed)
Refill for [facility] sent to [facility].

## 2022-06-14 NOTE — Telephone Encounter (Signed)
*  STAT* If patient is at the pharmacy, call can be transferred to refill team.   1. Which medications need to be refilled? (please list name of each medication and dose if known)  Take 1 tablet by mouth 2 (two) times daily.  ENTRESTO 24-26 MG    2. Which pharmacy/location (including street and city if local pharmacy) is medication to be sent to? [facility] - [place]    3. Do they need a 30 day or 90 day supply?  90 Days Supply

## 2022-06-20 NOTE — Telephone Encounter (Signed)
Inbound call from patient spouse f/u on transfer. Please advise on scheduling.  Thank you

## 2022-07-03 ENCOUNTER — Encounter: Payer: Self-pay | Admitting: Gastroenterology

## 2022-07-17 ENCOUNTER — Ambulatory Visit (AMBULATORY_SURGERY_CENTER): Payer: Commercial Managed Care - PPO

## 2022-07-17 ENCOUNTER — Encounter: Payer: Self-pay | Admitting: Gastroenterology

## 2022-07-17 VITALS — Ht 71.0 in | Wt 256.0 lb

## 2022-07-17 DIAGNOSIS — Z800 Family history of malignant neoplasm of digestive organs: Secondary | ICD-10-CM

## 2022-07-17 DIAGNOSIS — Z85038 Personal history of other malignant neoplasm of large intestine: Secondary | ICD-10-CM

## 2022-07-17 DIAGNOSIS — Z1211 Encounter for screening for malignant neoplasm of colon: Secondary | ICD-10-CM

## 2022-07-17 MED ORDER — NA SULFATE-K SULFATE-MG SULF 17.5-3.13-1.6 GM/177ML PO SOLN: 1.0000 | Freq: Once | ORAL | 0 refills | Status: AC

## 2022-07-17 NOTE — Progress Notes (Signed)

## 2022-07-24 ENCOUNTER — Ambulatory Visit (AMBULATORY_SURGERY_CENTER): Payer: Commercial Managed Care - PPO | Admitting: Gastroenterology

## 2022-07-24 ENCOUNTER — Encounter: Payer: Self-pay | Admitting: Gastroenterology

## 2022-07-24 VITALS — BP 120/84 | HR 73 | Temp 98.4°F | Resp 17 | Ht 71.0 in | Wt 256.0 lb

## 2022-07-24 DIAGNOSIS — Z1211 Encounter for screening for malignant neoplasm of colon: Secondary | ICD-10-CM

## 2022-07-24 DIAGNOSIS — Z800 Family history of malignant neoplasm of digestive organs: Secondary | ICD-10-CM

## 2022-07-24 MED ORDER — SODIUM CHLORIDE 0.9 % IV SOLN: 500.0000 mL | Freq: Once | INTRAVENOUS | Status: DC

## 2022-07-24 NOTE — Patient Instructions (Signed)
Resume previous diet. Continue present medications. Repeat colonoscopy in 5 years for screening purposes.  Handouts on hemorrhoids provided.  YOU HAD AN ENDOSCOPIC PROCEDURE TODAY AT THE [facility]:   Refer to the procedure report that was given to you for any specific questions about what was found during the examination.  If the procedure report does not answer your questions, please call your gastroenterologist to clarify.  If you requested that your care partner not be given the details of your procedure findings, then the procedure report has been included in a sealed envelope for you to review at your convenience later.  YOU SHOULD EXPECT: Some feelings of bloating in the abdomen. Passage of more gas than usual.  Walking can help get rid of the air that was put into your GI tract during the procedure and reduce the bloating. If you had a lower endoscopy (such as a colonoscopy or flexible sigmoidoscopy) you may notice spotting of blood in your stool or on the toilet paper. If you underwent a bowel prep for your procedure, you may not have a normal bowel movement for a few days.  Please Note:  You might notice some irritation and congestion in your nose or some drainage.  This is from the oxygen used during your procedure.  There is no need for concern and it should clear up in a day or so.  SYMPTOMS TO REPORT IMMEDIATELY:  Following lower endoscopy (colonoscopy or flexible sigmoidoscopy):  Excessive amounts of blood in the stool  Significant tenderness or worsening of abdominal pains  Swelling of the abdomen that is new, acute  Fever of 100F or higher  For urgent or emergent issues, a gastroenterologist can be reached at any hour by calling [phone]. Do not use MyChart messaging for urgent concerns.    DIET:  We do recommend a small meal at first, but then you may proceed to your regular diet.  Drink plenty of fluids but you should avoid alcoholic beverages for 24  hours.  ACTIVITY:  You should plan to take it easy for the rest of today and you should NOT DRIVE or use heavy machinery until tomorrow (because of the sedation medicines used during the test).    FOLLOW UP: Our staff will call the number listed on your records the next business day following your procedure.  We will call around 7:15- 8:00 am to check on you and address any questions or concerns that you may have regarding the information given to you following your procedure. If we do not reach you, we will leave a message.     If any biopsies were taken you will be contacted by phone or by letter within the next 1-3 weeks.  Please call us at [phone] if you have not heard about the biopsies in 3 weeks.    SIGNATURES/CONFIDENTIALITY: You and/or your care partner have signed paperwork which will be entered into your electronic medical record.  These signatures attest to the fact that that the information above on your After Visit Summary has been reviewed and is understood.  Full responsibility of the confidentiality of this discharge information lies with you and/or your care-partner.

## 2022-07-24 NOTE — Progress Notes (Signed)
Uneventful anesthetic. Report to pacu rn. Vss. Care resumed by rn. 

## 2022-07-24 NOTE — Op Note (Signed)
[facility] Patient Name: Darlene Bartelt Procedure Date: 07/24/2022 9:12 AM MRN: [id number] Endoscopist: Lynann Bologna , MD, [id number] Age: 52 Referring MD:  Date of Birth: 1970/10/31 Gender: Male Account #: [id number] Procedure:                Colonoscopy Indications:              Screening in patient at increased risk: Colorectal                            cancer in father at age 41, High risk colon cancer                            surveillance: Personal history of colonic polyps                            10/2016 (Dr Charm Barges) Medicines:                Monitored Anesthesia Care Procedure:                Pre-Anesthesia Assessment:                           - Prior to the procedure, a History and Physical                            was performed, and patient medications and                            allergies were reviewed. The patient's tolerance of                            previous anesthesia was also reviewed. The risks                            and benefits of the procedure and the sedation                            options and risks were discussed with the patient.                            All questions were answered, and informed consent                            was obtained. Prior Anticoagulants: The patient has                            taken no anticoagulant or antiplatelet agents. ASA                            Grade Assessment: II - A patient with mild systemic                            disease. After reviewing the risks and benefits,  the patient was deemed in satisfactory condition to                            undergo the procedure.                           After obtaining informed consent, the colonoscope                            was passed under direct vision. Throughout the                            procedure, the patient's blood pressure, pulse, and                            oxygen saturations were monitored  continuously. The                            CF [id number] #[id number] was introduced through the anus                            and advanced to the the cecum, identified by                            appendiceal orifice and ileocecal valve. The                            colonoscopy was performed without difficulty. The                            patient tolerated the procedure well. The quality                            of the bowel preparation was good. The ileocecal                            valve, appendiceal orifice, and rectum were                            photographed. Scope In: 9:17:24 AM Scope Out: 9:28:44 AM Scope Withdrawal Time: 0 hours 8 minutes 9 seconds  Total Procedure Duration: 0 hours 11 minutes 20 seconds  Findings:                 The colon (entire examined portion) appeared normal.                           Non-bleeding internal hemorrhoids were found during                            retroflexion. The hemorrhoids were small and Grade                            I (internal hemorrhoids that do not prolapse).  The exam was otherwise without abnormality on                            direct and retroflexion views. Complications:            No immediate complications. Estimated Blood Loss:     Estimated blood loss: none. Impression:               - The entire examined colon is normal.                           - Non-bleeding internal hemorrhoids.                           - The examination was otherwise normal on direct                            and retroflexion views.                           - No specimens collected.                           - The GI Genius (intelligent endoscopy module),                            computer-aided polyp detection system powered by AI                            was utilized to detect colorectal polyps through                            enhanced visualization during colonoscopy. Recommendation:           -  Patient has a contact number available for                            emergencies. The signs and symptoms of potential                            delayed complications were discussed with the                            patient. Return to normal activities tomorrow.                            Written discharge instructions were provided to the                            patient.                           - Resume previous diet.                           - Continue present medications.                           -  Repeat colonoscopy in 5 years for screening                            purposes. Earlier, if with any new problems or                            change in family history                           - The findings and recommendations were discussed                            with the patient's family. Lynann Bologna, MD 07/24/2022 9:33:35 AM This report has been signed electronically.

## 2022-07-24 NOTE — Progress Notes (Signed)
[facility] History and Physical   Primary Care Physician:  Gaye Alken, MD   Reason for Procedure:   FH CRC- dad  Plan:    colon     HPI: Patrick Strickland is a 52 y.o. male    Past Medical History:  Diagnosis Date   Anxiety    Diabetes mellitus without complication    Hyperlipidemia    Sleep apnea     Past Surgical History:  Procedure Laterality Date   ANKLE ARTHROSCOPY Left 2010   LEFT HEART CATH AND CORONARY ANGIOGRAPHY N/A 04/11/2020   Procedure: LEFT HEART CATH AND CORONARY ANGIOGRAPHY;  Surgeon: Yvonne Kendall, MD;  Location: [facility] INVASIVE [facility];  Service: Cardiovascular;  Laterality: N/A;    Prior to Admission medications   Medication Sig Start Date End Date Taking? Authorizing Provider  aspirin EC 81 MG tablet Take 1 tablet (81 mg total) by mouth daily. 04/12/20  Yes Bhagat, Bhavinkumar, PA  ENTRESTO 24-26 MG Take 1 tablet by mouth 2 (two) times daily. 06/14/22  Yes [name], MD  escitalopram (LEXAPRO) 20 MG tablet Take 20 mg by mouth at bedtime. 04/28/22  Yes [provider]  JARDIANCE 25 MG TABS tablet Take 25 mg by mouth daily. 05/14/22  Yes [provider]  metoprolol succinate (TOPROL-XL) 25 MG 24 hr tablet TAKE 1 TABLET BY MOUTH DAILY 11/28/20  Yes [name], MD  omega-3 acid ethyl esters (LOVAZA) 1 g capsule Take 2 capsules by mouth 2 (two) times daily. 04/04/20  Yes [provider]  rosuvastatin (CRESTOR) 20 MG tablet Take 20 mg by mouth at bedtime. 02/15/20  Yes [provider]  VASCEPA 1 g capsule Take 2 g by mouth 2 (two) times daily. 06/26/22  Yes [provider]  metFORMIN (GLUCOPHAGE-XR) 500 MG 24 hr tablet Take 500 mg by mouth daily. Patient not taking: Reported on 05/30/2022 02/15/20   [provider]  OZEMPIC, 1 MG/DOSE, 4 MG/3ML SOPN Inject 1 mg into the skin once a week. 05/21/22   [provider]  testosterone cypionate (DEPOTESTOSTERONE CYPIONATE) 200 MG/ML injection  Inject 200 mg into the muscle every 14 (fourteen) days. 05/23/22   [provider]    Current Outpatient Medications  Medication Sig Dispense Refill   aspirin EC 81 MG tablet Take 1 tablet (81 mg total) by mouth daily. 30 tablet 11   ENTRESTO 24-26 MG Take 1 tablet by mouth 2 (two) times daily. 180 tablet 3   escitalopram (LEXAPRO) 20 MG tablet Take 20 mg by mouth at bedtime.     JARDIANCE 25 MG TABS tablet Take 25 mg by mouth daily.     metoprolol succinate (TOPROL-XL) 25 MG 24 hr tablet TAKE 1 TABLET BY MOUTH DAILY 30 tablet 6   omega-3 acid ethyl esters (LOVAZA) 1 g capsule Take 2 capsules by mouth 2 (two) times daily.     rosuvastatin (CRESTOR) 20 MG tablet Take 20 mg by mouth at bedtime.     VASCEPA 1 g capsule Take 2 g by mouth 2 (two) times daily.     metFORMIN (GLUCOPHAGE-XR) 500 MG 24 hr tablet Take 500 mg by mouth daily. (Patient not taking: Reported on 05/30/2022)     OZEMPIC, 1 MG/DOSE, 4 MG/3ML SOPN Inject 1 mg into the skin once a week.     testosterone cypionate (DEPOTESTOSTERONE CYPIONATE) 200 MG/ML injection Inject 200 mg into the muscle every 14 (fourteen) days.     Current Facility-Administered Medications  Medication Dose  Route Frequency Provider Last Rate Last Admin   0.9 %  sodium chloride infusion  500 mL Intravenous Once Lynann Bologna, MD        Allergies as of 07/24/2022   (No Known Allergies)    Family History  Problem Relation Age of Onset   Colon polyps Father    Colon cancer Father    Esophageal cancer Neg Hx    Rectal cancer Neg Hx    Stomach cancer Neg Hx     Social History   Socioeconomic History   Marital status: Married    Spouse name: Not on file   Number of children: Not on file   Years of education: Not on file   Highest education level: Not on file  Occupational History   Not on file  Tobacco Use   Smoking status: Never   Smokeless tobacco: Current    Types: Chew  Vaping Use   Vaping Use: Never used  Substance and Sexual  Activity   Alcohol use: Yes    Alcohol/week: 1.0 standard drink of alcohol    Types: 1 Cans of beer per week   Drug use: Never   Sexual activity: Yes  Other Topics Concern   Not on file  Social History Narrative   ** Merged History Encounter **       Social Determinants of Health   Financial Resource Strain: Not on file  Food Insecurity: Not on file  Transportation Needs: Not on file  Physical Activity: Not on file  Stress: Not on file  Social Connections: Not on file  Intimate Partner Violence: Not on file    Review of Systems: Positive for none All other review of systems negative except as mentioned in the HPI.  Physical Exam: Vital signs in last 24 hours: @   General:   Alert,  Well-developed, well-nourished, pleasant and cooperative in NAD Lungs:  Clear throughout to auscultation.   Heart:  Regular rate and rhythm; no murmurs, clicks, rubs,  or gallops. Abdomen:  Soft, nontender and nondistended. Normal bowel sounds.   Neuro/Psych:  Alert and cooperative. Normal mood and affect. A and O x 3    No significant changes were identified.  The patient continues to be an appropriate candidate for the planned procedure and anesthesia.   Patrick Circle, MD. [facility] 07/24/2022 9:04 AM@

## 2022-07-24 NOTE — Progress Notes (Signed)
VS by EC  Pt's states no medical or surgical changes since previsit or office visit.  

## 2022-07-25 ENCOUNTER — Telehealth: Payer: Self-pay | Admitting: *Deleted

## 2022-07-25 NOTE — Telephone Encounter (Signed)
  Follow up Call-     07/24/2022    8:25 AM  Call back number  Post procedure Call Back phone  # [phone]  Permission to leave phone message Yes     Patient questions:  Do you have a fever, pain , or abdominal swelling? No. Pain Score  0 *  Have you tolerated food without any problems? Yes.    Have you been able to return to your normal activities? Yes.    Do you have any questions about your discharge instructions: Diet   No. Medications  No. Follow up visit  No.  Do you have questions or concerns about your Care? No.  Actions: * If pain score is 4 or above: No action needed, pain <4.

## 2023-07-23 ENCOUNTER — Other Ambulatory Visit: Payer: Self-pay | Admitting: Cardiovascular Disease

## 2023-12-03 ENCOUNTER — Other Ambulatory Visit: Payer: Self-pay | Admitting: Cardiovascular Disease

## 2024-01-07 ENCOUNTER — Ambulatory Visit: Attending: Cardiovascular Disease | Admitting: Cardiovascular Disease

## 2024-01-07 ENCOUNTER — Encounter: Payer: Self-pay | Admitting: Cardiovascular Disease

## 2024-01-07 VITALS — BP 124/82 | HR 82 | Ht 71.0 in | Wt 268.0 lb

## 2024-01-07 DIAGNOSIS — E782 Mixed hyperlipidemia: Secondary | ICD-10-CM

## 2024-01-07 DIAGNOSIS — I428 Other cardiomyopathies: Secondary | ICD-10-CM

## 2024-01-07 MED ORDER — VASCEPA 1 G PO CAPS: 2.0000 g | ORAL_CAPSULE | Freq: Two times a day (BID) | ORAL | 4 refills | Status: AC

## 2024-01-07 MED ORDER — ROSUVASTATIN CALCIUM 20 MG PO TABS: 20.0000 mg | ORAL_TABLET | Freq: Every day | ORAL | 4 refills | Status: DC

## 2024-01-07 MED ORDER — METOPROLOL SUCCINATE ER 25 MG PO TB24: 25.0000 mg | ORAL_TABLET | Freq: Every day | ORAL | 4 refills | Status: AC

## 2024-01-07 MED ORDER — ENTRESTO 24-26 MG PO TABS: 1.0000 | ORAL_TABLET | Freq: Two times a day (BID) | ORAL | 4 refills | Status: DC

## 2024-01-07 NOTE — Assessment & Plan Note (Signed)
 On CPAP. ?

## 2024-01-07 NOTE — Progress Notes (Signed)
 01/07/2024 Ubaldo JINNY Sours   1971/01/27  [id number]  Primary Physician Erick No, MD (Inactive) Primary Cardiologist: Dorn JINNY Lesches MD [facility] [place], [place]  HPI:  Patrick Strickland is a 53 y.o.  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 His primary care provider is [facility] and [place] .  I last saw him in the office 05/30/2022.  His cardiac risk factor profile is notable for treated hyperlipidemia and hypertension. I met him in early January when he was admitted in transfer from [facility] with chest pain and non-STEMI. He underwent diagnostic coronary angiography by Dr. Mady revealing no evidence of CAD with sluggish coronary blood flow. His EF was in the 40 to 45% range. Cardiac MRI did not show myocarditis. He was eventually discharged and placed on Entresto  and beta-blocker. His follow-up 2D echo performed 6 weeks later revealed improvement in his EF up to 55 to 60%. 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    Since I saw him 1-1/2 years ago he has remained stable.  He does have obstructive sleep apnea on CPAP.  He denies chest pain or shortness of breath.  He remains on guideline directed optimal medical therapy for his nonischemic cardiomyopathy.   Current Meds  Medication Sig   aspirin  EC 81 MG tablet Take 1 tablet (81 mg total) by mouth daily.   ENTRESTO  24-26 MG TAKE ONE TABLET BY MOUTH TWICE DAILY   escitalopram (LEXAPRO) 20 MG tablet Take 20 mg by mouth at bedtime.   JARDIANCE 25 MG TABS tablet Take 25 mg by mouth daily.   metoprolol  succinate (TOPROL -XL) 25 MG 24 hr tablet TAKE 1 TABLET BY MOUTH DAILY   OZEMPIC, 1 MG/DOSE, 4 MG/3ML SOPN Inject 1 mg into the skin once a week.   rosuvastatin  (CRESTOR ) 20 MG tablet Take 20 mg by mouth at bedtime.   VASCEPA 1 g capsule Take 2 g by mouth 2 (two) times daily.    [DISCONTINUED] omega-3 acid ethyl esters (LOVAZA) 1 g capsule Take 2 capsules by mouth 2 (two) times daily.     No Known Allergies  Social History   Socioeconomic History   Marital status: Married    Spouse name: Not on file   Number of children: Not on file   Years of education: Not on file   Highest education level: Not on file  Occupational History   Not on file  Tobacco Use   Smoking status: Never   Smokeless tobacco: Current    Types: Chew  Vaping Use   Vaping status: Never Used  Substance and Sexual Activity   Alcohol use: Yes    Alcohol/week: 1.0 standard drink of alcohol    Types: 1 Cans of beer per week   Drug use: Never   Sexual activity: Yes  Other Topics Concern   Not on file  Social History Narrative   ** Merged History Encounter **       Social Drivers of Corporate investment banker Strain: Not on file  Food Insecurity: Not on file  Transportation Needs: Not on file  Physical Activity: Not on file  Stress: Not on file  Social Connections: Not on file  Intimate Partner Violence: Not on file     Review of Systems: General: negative for chills, fever, night sweats or weight changes.  Cardiovascular: negative for chest pain, dyspnea  on exertion, edema, orthopnea, palpitations, paroxysmal nocturnal dyspnea or shortness of breath Dermatological: negative for rash Respiratory: negative for cough or wheezing Urologic: negative for hematuria Abdominal: negative for nausea, vomiting, diarrhea, bright red blood per rectum, melena, or hematemesis Neurologic: negative for visual changes, syncope, or dizziness All other systems reviewed and are otherwise negative except as noted above.    Blood pressure 124/82, pulse 82, height 5' 11 (1.803 m), weight 268 lb (121.6 kg), SpO2 94%.  General appearance: alert and no distress Neck: no adenopathy, no carotid bruit, no JVD, supple, symmetrical, trachea midline, and thyroid not enlarged, symmetric, no  tenderness/mass/nodules Lungs: clear to auscultation bilaterally Heart: regular rate and rhythm, S1, S2 normal, no murmur, click, rub or gallop Extremities: extremities normal, atraumatic, no cyanosis or edema Pulses: 2+ and symmetric Skin: Skin color, texture, turgor normal. No rashes or lesions Neurologic: Grossly normal  EKG EKG Interpretation Date/Time:  Tuesday January 07 2024 09:21:34 EDT Ventricular Rate:  83 PR Interval:  172 QRS Duration:  98 QT Interval:  362 QTC Calculation: 425 R Axis:   -61  Text Interpretation: Normal sinus rhythm Pulmonary disease pattern Left anterior fascicular block Cannot rule out Inferior infarct (masked by fascicular block?) , age undetermined When compared with ECG of 12-Apr-2020 06:36, No significant change was found Confirmed by Court Carrier [phone]) on 01/07/2024 9:24:48 AM    ASSESSMENT AND PLAN:   HLD (hyperlipidemia) History of hyperlipidemia on  Vascepa and statin therapy with lipid profile performed 12/26/2023 revealing total cholesterol of 186, LDL 90, HDL 26 and a triglyceride level of 426.  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  I am referring him to Dr. Mona in lipid clinic for further evaluation and treatment options.  NICM (nonischemic cardiomyopathy) (HCC) History of nonischemic cardiomyopathy with cardiac catheterization performed by Dr. Mady 04/11/2020 revealing normal coronary arteries with LV dysfunction.  He was placed on GDMT and his most recent echo performed 05/23/2020 revealed normalization of his LV function with no valvular abnormalities.  He is completely asymptomatic.  Obstructive sleep apnea On CPAP     Carrier DOROTHA Court MD [facility], [facility] 01/07/2024 9:33 AM

## 2024-01-07 NOTE — Assessment & Plan Note (Signed)
 History of nonischemic cardiomyopathy with cardiac catheterization performed by Dr. Mady 04/11/2020 revealing normal coronary arteries with LV dysfunction.  He was placed on GDMT and his most recent echo performed 05/23/2020 revealed normalization of his LV function with no valvular abnormalities.  He is completely asymptomatic.

## 2024-01-07 NOTE — Patient Instructions (Signed)

## 2024-01-07 NOTE — Assessment & Plan Note (Signed)
 History of hyperlipidemia on  Vascepa and statin therapy with lipid profile performed 12/26/2023 revealing total cholesterol of 186, LDL 90, HDL 26 and a triglyceride level of 426.  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  I am referring him to Dr. Mona in lipid clinic for further evaluation and treatment options.

## 2024-01-14 ENCOUNTER — Telehealth: Payer: Self-pay | Admitting: Cardiovascular Disease

## 2024-01-14 MED ORDER — SACUBITRIL-VALSARTAN 24-26 MG PO TABS: 1.0000 | ORAL_TABLET | Freq: Two times a day (BID) | ORAL | 4 refills | Status: AC

## 2024-01-14 NOTE — Telephone Encounter (Signed)
 Pt c/o medication issue:  1. Name of Medication:   ENTRESTO  24-26 MG    2. How are you currently taking this medication (dosage and times per day)? As written  3. Are you having a reaction (difficulty breathing--STAT)? No   4. What is your medication issue? Pt's ins will only cover generic. Please re-send to   [facility] - [place] - [address] Phone: [phone]  Fax: [phone]

## 2024-01-14 NOTE — Telephone Encounter (Signed)
 Patient aware generic medication e-sent to pharmacy

## 2024-02-03 ENCOUNTER — Institutional Professional Consult (permissible substitution) (HOSPITAL_BASED_OUTPATIENT_CLINIC_OR_DEPARTMENT_OTHER): Admitting: Internal Medicine

## 2024-03-03 ENCOUNTER — Ambulatory Visit (INDEPENDENT_AMBULATORY_CARE_PROVIDER_SITE_OTHER): Admitting: Internal Medicine

## 2024-03-03 ENCOUNTER — Encounter (HOSPITAL_BASED_OUTPATIENT_CLINIC_OR_DEPARTMENT_OTHER): Payer: Self-pay | Admitting: Internal Medicine

## 2024-03-03 VITALS — BP 138/86 | HR 85 | Ht 71.0 in | Wt 273.2 lb

## 2024-03-03 DIAGNOSIS — I428 Other cardiomyopathies: Secondary | ICD-10-CM

## 2024-03-03 DIAGNOSIS — E781 Pure hyperglyceridemia: Secondary | ICD-10-CM

## 2024-03-03 DIAGNOSIS — E119 Type 2 diabetes mellitus without complications: Secondary | ICD-10-CM | POA: Diagnosis not present

## 2024-03-03 NOTE — Progress Notes (Signed)
 LIPID CLINIC CONSULT NOTE  Chief Complaint:  High triglycerides  Primary Care Physician: Erick No, MD (Inactive)  Primary Cardiologist:  Dorn Lesches, MD  HPI:  Patrick Strickland is a 53 y.o. male who is being seen today for the evaluation of high triglycerides at the request of Lesches Dorn PARAS, MD. this is a pleasant 53 year old male kindly referred for evaluation management of severe hypertriglyceridemia.  [name] has a history of high triglycerides and reports his triglycerides had been as high as 1600 in the past.  He first noted high triglycerides probably in his 73s.  There is family history of high triglycerides.  He subsequently has been on good therapy with rosuvastatin  40 mg daily and Vascepa  2 g twice daily.  Recent labs in September showed total cholesterol of 186, triglycerides 426, HDL 26 and LDL 90.  He says at that time his diet was not as ideal as his father was ill and recently passed away and he was caring for him.  He also had been on Ozempic but that medication had been stopped recently.  His primary provider was trying to switch him to Mounjaro.  Our records indicate she may be inactive in our system asked him to reach out to see if she is still practicing.  He does have diabetes as well.  A1c was 6.4% in September.  He does not have any known coronary disease having undergone cardiac catheterization in 2022 for cardiomyopathy.  His cath at that time showed normal coronaries.  Cardiac MRI showed LVEF 49%.  PMHx:  Past Medical History:  Diagnosis Date   Anxiety    Diabetes mellitus without complication (HCC)    Hyperlipidemia    Sleep apnea     Past Surgical History:  Procedure Laterality Date   ANKLE ARTHROSCOPY Left 2010   LEFT HEART CATH AND CORONARY ANGIOGRAPHY N/A 04/11/2020   Procedure: LEFT HEART CATH AND CORONARY ANGIOGRAPHY;  Surgeon: Mady Bruckner, MD;  Location: [facility];  Service: Cardiovascular;  Laterality: N/A;    FAMHx:   Family History  Problem Relation Age of Onset   Colon polyps Father    Colon cancer Father    Esophageal cancer Neg Hx    Rectal cancer Neg Hx    Stomach cancer Neg Hx     SOCHx:   reports that he has never smoked. His smokeless tobacco use includes chew. He reports current alcohol use of about 1.0 standard drink of alcohol per week. He reports that he does not use drugs.  ALLERGIES:  No Known Allergies  ROS: Pertinent items noted in HPI and remainder of comprehensive ROS otherwise negative.  HOME MEDS: Current Outpatient Medications on File Prior to Visit  Medication Sig Dispense Refill   aspirin  EC 81 MG tablet Take 1 tablet (81 mg total) by mouth daily. 30 tablet 11   Cholecalciferol (VITAMIN D3) 50 MCG (2000 UT) capsule Take 2,000 Units by mouth daily.     escitalopram (LEXAPRO) 20 MG tablet Take 20 mg by mouth at bedtime.     JARDIANCE 25 MG TABS tablet Take 25 mg by mouth daily.     metoprolol  succinate (TOPROL -XL) 25 MG 24 hr tablet Take 1 tablet (25 mg total) by mouth daily. 90 tablet 4   Multiple Vitamin (MULTIVITAMIN) tablet Take 1 tablet by mouth daily.     rosuvastatin  (CRESTOR ) 40 MG tablet SMARTSIG:1 Tablet(s) By Mouth     sacubitril -valsartan  (ENTRESTO ) 24-26 MG Take 1 tablet by mouth  2 (two) times daily. 180 tablet 4   OZEMPIC, 1 MG/DOSE, 4 MG/3ML SOPN Inject 1 mg into the skin once a week. (Patient not taking: Reported on 03/03/2024)     VASCEPA  1 g capsule Take 2 capsules (2 g total) by mouth 2 (two) times daily. (Patient not taking: Reported on 03/03/2024) 360 capsule 4   No current facility-administered medications on file prior to visit.    LABS/IMAGING: No results found for this or any previous visit (from the past 48 hours). No results found.  LIPID PANEL:    Component Value Date/Time   CHOL 160 04/11/2020 0610   TRIG 153 (H) 04/11/2020 0610   HDL 41 04/11/2020 0610   CHOLHDL 3.9 04/11/2020 0610   VLDL 31 04/11/2020 0610   LDLCALC 88 04/11/2020  0610    No results found for: LIPOA   WEIGHTS: Wt Readings from Last 3 Encounters:  03/03/24 273 lb 3.2 oz (123.9 kg)  01/07/24 268 lb (121.6 kg)  07/24/22 256 lb (116.1 kg)    VITALS: BP 138/86   Pulse 85   Ht 5' 11 (1.803 m)   Wt 273 lb 3.2 oz (123.9 kg)   SpO2 97%   BMI 38.10 kg/m   EXAM: Deferred  EKG: Deferred  ASSESSMENT: Severe hypertriglyceridemia Type 2 diabetes-A1c 6.4% Nonischemic cardiomyopathy, LVEF 49% -> improved to 55 to 60% in 2022  PLAN: 1.   [name] has severe hypertriglyceridemia with triglycerides in the thousands in the past, however has never had pancreatitis.  He does have diabetes however A1c has been fairly well-controlled.  I suspect his triglycerides recently were higher related to diet.  I provided dietary information today we discussed about ways to further lower his triglycerides.  He is on good medical therapy with rosuvastatin  and Vascepa .  If his triglycerides are over 500 he might benefit from a fibrate.  I suspect he can lower than further however with diet and weight loss.  He may also benefit from switching to Mounjaro which was the plan with his primary care provider.  I have encouraged him to pursue that with her and if not successful he could reach out to us  and we could see if we can get him approved for that medication.  Plan follow-up with a direct LDL and lipid profile as well as LP(a) in 3 months or sooner as necessary with me at [facility].  Thanks again for the kind referral.  Vinie KYM Maxcy, MD, [facility], FNLA, FACP    [facility]  Medical Director of the Advanced Lipid Disorders &  Cardiovascular Risk Reduction Clinic Diplomate of the American Board of Clinical Lipidology Attending Cardiologist  Direct Dial: [phone]  Fax: [phone]  Website:  [email or web address] Vinie BROCKS Delmore Sear 03/03/2024, 9:15 AM

## 2024-03-03 NOTE — Patient Instructions (Signed)
 Continue working on diet and healthy lifestyle  Medication Instructions:  No changes today *If you need a refill on your cardiac medications before your next appointment, please call your pharmacy*  Lab Work: In about 3 months - return for fasting blood work - lipid panel, direct LDL and Lp(a)   Testing/Procedures: none  Follow-Up: At [facility], you and your health needs are our priority.  As part of our continuing mission to provide you with exceptional heart care, our providers are all part of one team.  This team includes your primary Cardiologist (physician) and Advanced Practice Providers or APPs (Physician Assistants and Nurse Practitioners) who all work together to provide you with the care you need, when you need it.  Your next appointment:   3 month(s)  Provider:   Dr. Mona - [facility]

## 2024-06-08 ENCOUNTER — Ambulatory Visit: Admitting: Internal Medicine
